# Patient Record
Sex: Female | Born: 1985 | Hispanic: No | Smoking: Never smoker
Health system: Southern US, Community
[De-identification: ages and names within clinical notes are randomized; demographics above are authoritative.]

## PROBLEM LIST (undated history)

## (undated) ENCOUNTER — Emergency Department (HOSPITAL_COMMUNITY): Discharge status: Absent without leave | Payer: Self-pay

## (undated) DIAGNOSIS — E282 Polycystic ovarian syndrome: Secondary | ICD-10-CM

## (undated) DIAGNOSIS — M25472 Effusion, left ankle: Secondary | ICD-10-CM

## (undated) DIAGNOSIS — R202 Paresthesia of skin: Secondary | ICD-10-CM

## (undated) DIAGNOSIS — R200 Anesthesia of skin: Secondary | ICD-10-CM

## (undated) DIAGNOSIS — G4700 Insomnia, unspecified: Secondary | ICD-10-CM

## (undated) DIAGNOSIS — Q369 Cleft lip, unilateral: Secondary | ICD-10-CM

## (undated) DIAGNOSIS — K219 Gastro-esophageal reflux disease without esophagitis: Secondary | ICD-10-CM

## (undated) DIAGNOSIS — T1491XA Suicide attempt, initial encounter: Secondary | ICD-10-CM

## (undated) DIAGNOSIS — M25471 Effusion, right ankle: Secondary | ICD-10-CM

## (undated) DIAGNOSIS — R519 Headache, unspecified: Secondary | ICD-10-CM

## (undated) DIAGNOSIS — T402X1A Poisoning by other opioids, accidental (unintentional), initial encounter: Secondary | ICD-10-CM

## (undated) DIAGNOSIS — R51 Headache: Secondary | ICD-10-CM

## (undated) DIAGNOSIS — Z9889 Other specified postprocedural states: Secondary | ICD-10-CM

## (undated) DIAGNOSIS — R112 Nausea with vomiting, unspecified: Secondary | ICD-10-CM

## (undated) DIAGNOSIS — J40 Bronchitis, not specified as acute or chronic: Secondary | ICD-10-CM

## (undated) HISTORY — PX: CLEFT LIP REPAIR: SUR1164

## (undated) HISTORY — PX: COLPOSCOPY: SHX161

## (undated) HISTORY — PX: ABDOMINAL HYSTERECTOMY: SHX81

## (undated) HISTORY — DX: Cleft lip, unilateral: Q36.9

## (undated) HISTORY — PX: PILONIDAL CYST EXCISION: SHX744

---
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
## 2008-06-25 ENCOUNTER — Emergency Department (HOSPITAL_COMMUNITY): Admission: EM | Admit: 2008-06-25 | Discharge: 2008-06-25 | Payer: Self-pay | Admitting: Emergency Medicine

## 2014-12-27 ENCOUNTER — Encounter (HOSPITAL_COMMUNITY): Payer: Self-pay

## 2014-12-27 ENCOUNTER — Encounter (HOSPITAL_COMMUNITY)
Admission: RE | Admit: 2014-12-27 | Discharge: 2014-12-27 | Disposition: A | Discharge status: Home / Self Care | Source: Ambulatory Visit | Attending: Obstetrics and Gynecology | Admitting: Obstetrics and Gynecology

## 2014-12-27 DIAGNOSIS — Z01812 Encounter for preprocedural laboratory examination: Secondary | ICD-10-CM | POA: Insufficient documentation

## 2014-12-27 HISTORY — DX: Bronchitis, not specified as acute or chronic: J40

## 2014-12-27 HISTORY — DX: Effusion, left ankle: M25.472

## 2014-12-27 HISTORY — DX: Headache, unspecified: R51.9

## 2014-12-27 HISTORY — DX: Polycystic ovarian syndrome: E28.2

## 2014-12-27 HISTORY — DX: Insomnia, unspecified: G47.00

## 2014-12-27 HISTORY — DX: Headache: R51

## 2014-12-27 HISTORY — DX: Paresthesia of skin: R20.2

## 2014-12-27 HISTORY — DX: Effusion, right ankle: M25.471

## 2014-12-27 HISTORY — DX: Anesthesia of skin: R20.0

## 2014-12-27 HISTORY — DX: Nausea with vomiting, unspecified: R11.2

## 2014-12-27 HISTORY — DX: Gastro-esophageal reflux disease without esophagitis: K21.9

## 2014-12-27 HISTORY — DX: Other specified postprocedural states: Z98.890

## 2014-12-27 LAB — CBC
HEMATOCRIT: 39.5 % (ref 36.0–46.0)
HEMOGLOBIN: 12.9 g/dL (ref 12.0–15.0)
MCH: 25.6 pg — ABNORMAL LOW (ref 26.0–34.0)
MCHC: 32.7 g/dL (ref 30.0–36.0)
MCV: 78.4 fL (ref 78.0–100.0)
Platelets: 286 10*3/uL (ref 150–400)
RBC: 5.04 MIL/uL (ref 3.87–5.11)
RDW: 14.5 % (ref 11.5–15.5)
WBC: 6 10*3/uL (ref 4.0–10.5)

## 2014-12-27 NOTE — Patient Instructions (Addendum)
Your procedure is scheduled on:  Friday, January 05, 2015  Enter through the Main Entrance of [facility] at: 1:00 p.m.  Pick up the phone at the desk and dial [phone].  Call this number if you have problems the morning of surgery: [phone].  Remember: Do NOT eat food: AFTER MIDNIGHT THURSDAY  Do NOT drink clear liquids after: AFTER 10:30 A.M. Friday MORNING Take these medicines the morning of surgery with a SIP OF WATER: NONE *STOP ALL VITAMINS/HERBAL SUPPLEMENTS, IBUPROFEN AND KETOROLAC  Do NOT wear jewelry (body piercing), metal hair clips/bobby pins, make-up, or nail polish. Do NOT wear lotions, powders, or perfumes.  You may wear deoderant. Do NOT shave for 48 hours prior to surgery. Do NOT bring valuables to the hospital. Contacts, dentures, or bridgework may not be worn into surgery.  Have a responsible adult drive you home and stay with you for 24 hours after your procedure

## 2015-01-05 ENCOUNTER — Encounter (HOSPITAL_COMMUNITY): Admission: RE | Payer: Self-pay | Source: Ambulatory Visit

## 2015-01-05 ENCOUNTER — Ambulatory Visit (HOSPITAL_COMMUNITY): Admission: RE | Admit: 2015-01-05 | Source: Ambulatory Visit | Admitting: Obstetrics and Gynecology

## 2015-01-05 SURGERY — LAPAROSCOPY OPERATIVE
Anesthesia: General

## 2015-01-25 ENCOUNTER — Other Ambulatory Visit: Payer: Self-pay | Admitting: Obstetrics and Gynecology

## 2015-01-29 ENCOUNTER — Encounter (HOSPITAL_COMMUNITY): Payer: Self-pay

## 2015-01-29 ENCOUNTER — Encounter (INDEPENDENT_AMBULATORY_CARE_PROVIDER_SITE_OTHER): Payer: Self-pay

## 2015-01-29 ENCOUNTER — Encounter (HOSPITAL_COMMUNITY)
Admission: RE | Admit: 2015-01-29 | Discharge: 2015-01-29 | Disposition: A | Discharge status: Home / Self Care | Source: Ambulatory Visit | Attending: Obstetrics and Gynecology | Admitting: Obstetrics and Gynecology

## 2015-01-29 DIAGNOSIS — R102 Pelvic and perineal pain: Secondary | ICD-10-CM | POA: Insufficient documentation

## 2015-01-29 DIAGNOSIS — Z01818 Encounter for other preprocedural examination: Secondary | ICD-10-CM | POA: Diagnosis not present

## 2015-01-29 DIAGNOSIS — N946 Dysmenorrhea, unspecified: Secondary | ICD-10-CM | POA: Insufficient documentation

## 2015-01-29 LAB — CBC
HCT: 39 % (ref 36.0–46.0)
Hemoglobin: 12.5 g/dL (ref 12.0–15.0)
MCH: 25.1 pg — ABNORMAL LOW (ref 26.0–34.0)
MCHC: 32.1 g/dL (ref 30.0–36.0)
MCV: 78.3 fL (ref 78.0–100.0)
Platelets: 358 10*3/uL (ref 150–400)
RBC: 4.98 MIL/uL (ref 3.87–5.11)
RDW: 15.1 % (ref 11.5–15.5)
WBC: 6.5 10*3/uL (ref 4.0–10.5)

## 2015-01-29 NOTE — Patient Instructions (Signed)
   Your procedure is scheduled on: FEB 16 AT 830AM  Enter through the Main Entrance of [facility] at 7am Pick up the phone at the desk and dial [phone] and inform us of your arrival.  Please call this number if you have any problems the morning of surgery: [phone]  Remember: Do not eat food after midnight:feb 15 Do not drink clear liquids after:feb 15 Take these medicines the morning of surgery with a SIP OF WATER: bring inhaler day of surgery   Do not wear jewelry, make-up, or FINGER nail polish No metal in your hair or on your body. Do not wear lotions, powders, perfumes.  You may wear deodorant.  Do not bring valuables to the hospital. Contacts, dentures or bridgework may not be worn into surgery.  Leave suitcase in the car. After Surgery it may be brought to your room. For patients being admitted to the hospital, checkout time is 11:00am the day of discharge.    Patients discharged on the day of surgery will not be allowed to drive home.

## 2015-01-31 ENCOUNTER — Other Ambulatory Visit (HOSPITAL_COMMUNITY): Payer: Self-pay | Admitting: Obstetrics and Gynecology

## 2015-01-31 NOTE — H&P (Signed)
Mindy Kelley is a 29 y.o.  P 0-0-9-0, who  presents for hysterectomy because of chronic pelvic pain, dysmenorrhea and menorrhagia.  Since menarche the  patient has had severe menstrual cycles characterized by severe cramping, that now is only made better with narcotic analgesia,  and a menstrual flow that lasts from 5-21 days.  She has to change her pad every 30 minutes to 3 hours  and toward the end of her bleeding she is able to manage her accompanying pain with Toradol minus the narcotics.  Aside from menstrual discomfort she experiences pelvic pain, most days of the week without initiating or alleviating factors, except  though intensity is decreased  with heating pads and ice packs.  She admits to positional dyspareunia, non-radiating lower back pain and episodes of  alternating constipation and diarrhea.  She denies any urinary tract or vaginitis symptoms.  Over the years she  has tried oral contraceptives,  the hormonal patch and ring with all providing only transient management of her symptoms.  She is currently taking oral contraception with Aygestin but only negligible change in any of her symptoms.  A pelvic ultrasound in July 2015  revealed a uterus: 5.05 x 4.22 x 3.11 cm, right ovary: 3.28 x 2.42 x 1.96 cm and left ovary: 3.05 x 2.01 x 2.20 cm.  During this evaluation the patient's CBC, CMET & TSH have been normal and gonorrhea, chlamydia,  syphilis, hepatitis B & C, HIV tests have been negative.  A review of both medical and surgical management options have been given to the patient regarding her condition.  After careful thought however, she has decided that due to the debilitating and protracted nature of her symptoms and the sub-optimal response to previous therapies that she would like to proceed with definitive therapy in the form of hysterectomy and possible bilateral oophorectomy.   Past Medical History  OB History: G: 9;  P: 0-0-9-0-recurrent miscarriages  GYN History: menarche: 29 YO     LMP: 01/13/2015    Contracepton oral contraceptives (estrogen/progesterone)  The patient reports a past history of: herpes. Remote history of abnormal PAP smears that were simply repeated;   Last PAP smear: 2016-normal  Medical History: Polycystic Ovarian Syndrome, Insulin Resistance, Hidradenitis Supprativa, Eczema, Migraine, Gastroesophageal Reflux Disease, Chronic Lower Back Pain, Vitamin D Deficiency, Pilonidal Cyst, Anxiety, Cleft Lip and Recurrent Miscarriages  Surgical History: 1987 Cleft Lip Repair and 2009 Pilonidal Cyst Excision  Report awakening from anesthesia and experiencing severe post operative nausea and vomiting but denies history of blood transfusions  Family History: Heart Disease, Asthma, Irritable Bowel Syndrome, Gastroesophageal Reflux Disease, Pulmonary Embolism, Diabetes Mellitus, Renal Failure, Stroke, Breast Cancer, Migraine and Multiple Sclerosis  Social History: Separated and currently a Consulting civil engineer in Social Work/Psychology;  Denies tobacco or alcohol use   Medications:  Dilaudid 2 mg every 4-6 hours as needed for pain FeSO4  325 mg daily Ibuprofen 800 mg with food every 8 hours as directed Ketorolac 10 mg with food every 6 hours as directed  Methocarbamol 500 mg  #2 four times a day as needed Norethindrone 5 mg daily Ortho Novum 1/35 daily Valacyclorvir 500 mg as directed HCTZ daily as directed prn  Allergies  Allergen Reactions  . Latex Hives  . Mango Flavor Hives, Itching and Swelling    Swelling is of the lips and tongue.  . Metformin And Related Nausea And Vomiting  Latex-hives  Denies sensitivity to peanuts, shellfish, soy or adhesives.   Physical Exam  Bp: 122/70  P: 82  R: 16  Temperature: 98.4 degrees F orally  Weight: 208 lbs.  Height: 5'5"  BMI: 34.6  Neck: supple without masses or thyromegaly Lungs: clear to auscultation Heart: regular rate and rhythm Abdomen: soft, tender with voluntary guarding and no organomegaly Pelvic:EGBUS-  wnl; vagina-normal rugae with brown discharge; uterus-normal size, very tender, cervix without lesions or motion tenderness; adnexae-bilateral tenderness but no  masses Extremities:  no clubbing, cyanosis or edema   Assesment: Chronic Pelvic Pain           Severe Dysmenorrhea           Menorrhagia   Disposition: Reviewed the risks of surgery to include, but not limited to: reaction to anesthesia, damage to adjacent organs, infection, excessive bleeding, possible open abdominal incision and menopausal risks if ovaries are removed (symptomatic & or cardiolvascular). The patient verbalized understanding of these risks and has consented to proceed with Laparoscopically Assisted Vaginal Hysterectomy with Bilateral Salpingectomy,  Possible Total Abdominal [facility]ste[facility] on February 06, 2015 at 7:30 a.m.  CSN# [id number]   Chesni Vos J. Lowell GuitarPowell, PA-C  for Dr. Maris BergerVanessa P. Haygood

## 2015-02-06 ENCOUNTER — Encounter (HOSPITAL_COMMUNITY): Payer: Self-pay | Admitting: Certified Registered Nurse Anesthetist

## 2015-02-06 ENCOUNTER — Ambulatory Visit (HOSPITAL_COMMUNITY): Admitting: Certified Registered Nurse Anesthetist

## 2015-02-06 ENCOUNTER — Encounter (HOSPITAL_COMMUNITY)
Admission: RE | Disposition: A | Discharge status: Home / Self Care | Payer: Self-pay | Source: Ambulatory Visit | Attending: Obstetrics and Gynecology

## 2015-02-06 ENCOUNTER — Observation Stay (HOSPITAL_COMMUNITY)
Admission: RE | Admit: 2015-02-06 | Discharge: 2015-02-07 | Disposition: A | Discharge status: Home / Self Care | Source: Ambulatory Visit | Attending: Obstetrics and Gynecology | Admitting: Obstetrics and Gynecology

## 2015-02-06 DIAGNOSIS — N946 Dysmenorrhea, unspecified: Secondary | ICD-10-CM | POA: Diagnosis present

## 2015-02-06 DIAGNOSIS — N920 Excessive and frequent menstruation with regular cycle: Secondary | ICD-10-CM | POA: Diagnosis present

## 2015-02-06 DIAGNOSIS — M25559 Pain in unspecified hip: Secondary | ICD-10-CM | POA: Diagnosis present

## 2015-02-06 DIAGNOSIS — E282 Polycystic ovarian syndrome: Secondary | ICD-10-CM | POA: Diagnosis not present

## 2015-02-06 DIAGNOSIS — N939 Abnormal uterine and vaginal bleeding, unspecified: Secondary | ICD-10-CM | POA: Diagnosis present

## 2015-02-06 DIAGNOSIS — Z8742 Personal history of other diseases of the female genital tract: Secondary | ICD-10-CM

## 2015-02-06 DIAGNOSIS — R102 Pelvic and perineal pain: Secondary | ICD-10-CM | POA: Diagnosis present

## 2015-02-06 HISTORY — PX: LAPAROSCOPIC ASSISTED VAGINAL HYSTERECTOMY: SHX5398

## 2015-02-06 LAB — PREGNANCY, URINE: PREG TEST UR: NEGATIVE

## 2015-02-06 SURGERY — HYSTERECTOMY, VAGINAL, LAPAROSCOPY-ASSISTED
Anesthesia: General | Site: Abdomen | Laterality: Bilateral

## 2015-02-06 MED ORDER — NEOSTIGMINE METHYLSULFATE 10 MG/10ML IV SOLN
INTRAVENOUS | Status: AC
Start: 2015-02-06 — End: 2015-02-06
  Filled 2015-02-06: qty 1

## 2015-02-06 MED ORDER — 0.9 % SODIUM CHLORIDE (POUR BTL) OPTIME
TOPICAL | Status: DC | PRN
  Administered 2015-02-06: 1000 mL

## 2015-02-06 MED ORDER — ONDANSETRON HCL 4 MG/2ML IJ SOLN
INTRAMUSCULAR | Status: DC | PRN
  Administered 2015-02-06: 4 mg via INTRAVENOUS

## 2015-02-06 MED ORDER — VASOPRESSIN 20 UNIT/ML IV SOLN
INTRAVENOUS | Status: DC | PRN
  Administered 2015-02-06: 20 mL via INTRAMUSCULAR

## 2015-02-06 MED ORDER — DIPHENHYDRAMINE HCL 12.5 MG/5ML PO ELIX: 12.5000 mg | ORAL_SOLUTION | Freq: Four times a day (QID) | ORAL | Status: DC | PRN

## 2015-02-06 MED ORDER — PANTOPRAZOLE SODIUM 40 MG PO TBEC
40.0000 mg | DELAYED_RELEASE_TABLET | Freq: Every day | ORAL | Status: AC
  Administered 2015-02-06: 40 mg via ORAL

## 2015-02-06 MED ORDER — LACTATED RINGERS IV SOLN
INTRAVENOUS | Status: DC
Start: 2015-02-06 — End: 2015-02-06
  Administered 2015-02-06 (×3): via INTRAVENOUS

## 2015-02-06 MED ORDER — MIDAZOLAM HCL 2 MG/2ML IJ SOLN
INTRAMUSCULAR | Status: AC
  Filled 2015-02-06: qty 2

## 2015-02-06 MED ORDER — LACTATED RINGERS IV SOLN
INTRAVENOUS | Status: DC
  Administered 2015-02-06 – 2015-02-07 (×2): via INTRAVENOUS

## 2015-02-06 MED ORDER — SCOPOLAMINE 1 MG/3DAYS TD PT72
1.0000 | MEDICATED_PATCH | Freq: Once | TRANSDERMAL | Status: DC
  Administered 2015-02-06: 1.5 mg via TRANSDERMAL

## 2015-02-06 MED ORDER — GLYCOPYRROLATE 0.2 MG/ML IJ SOLN
INTRAMUSCULAR | Status: AC
  Filled 2015-02-06: qty 3

## 2015-02-06 MED ORDER — SODIUM CHLORIDE 0.9 % IJ SOLN
INTRAMUSCULAR | Status: AC
  Filled 2015-02-06: qty 50

## 2015-02-06 MED ORDER — ONDANSETRON HCL 4 MG/2ML IJ SOLN
4.0000 mg | Freq: Four times a day (QID) | INTRAMUSCULAR | Status: DC | PRN
  Administered 2015-02-06: 4 mg via INTRAVENOUS
  Filled 2015-02-06: qty 2

## 2015-02-06 MED ORDER — DIPHENHYDRAMINE HCL 50 MG/ML IJ SOLN
INTRAMUSCULAR | Status: DC | PRN
  Administered 2015-02-06: 25 mg via INTRAVENOUS

## 2015-02-06 MED ORDER — DIPHENHYDRAMINE HCL 50 MG/ML IJ SOLN
INTRAMUSCULAR | Status: AC
  Filled 2015-02-06: qty 1

## 2015-02-06 MED ORDER — BUPIVACAINE HCL (PF) 0.25 % IJ SOLN
INTRAMUSCULAR | Status: DC | PRN
  Administered 2015-02-06: 10 mL

## 2015-02-06 MED ORDER — GLYCOPYRROLATE 0.2 MG/ML IJ SOLN
INTRAMUSCULAR | Status: DC | PRN
  Administered 2015-02-06: 0.3 mg via INTRAVENOUS

## 2015-02-06 MED ORDER — MEPERIDINE HCL 25 MG/ML IJ SOLN: 6.2500 mg | INTRAMUSCULAR | Status: DC | PRN

## 2015-02-06 MED ORDER — DEXTROSE 5 % IV SOLN
2.0000 g | Freq: Two times a day (BID) | INTRAVENOUS | Status: DC
  Administered 2015-02-06: 2 g via INTRAVENOUS
  Filled 2015-02-06 (×4): qty 2

## 2015-02-06 MED ORDER — LIDOCAINE HCL (CARDIAC) 20 MG/ML IV SOLN
INTRAVENOUS | Status: DC | PRN
  Administered 2015-02-06: 80 mg via INTRAVENOUS

## 2015-02-06 MED ORDER — MENTHOL 3 MG MT LOZG: 1.0000 | LOZENGE | OROMUCOSAL | Status: DC | PRN

## 2015-02-06 MED ORDER — KETOROLAC TROMETHAMINE 30 MG/ML IJ SOLN
30.0000 mg | Freq: Four times a day (QID) | INTRAMUSCULAR | Status: AC
Start: 2015-02-06 — End: 2015-02-07
  Administered 2015-02-06 – 2015-02-07 (×3): 30 mg via INTRAVENOUS
  Filled 2015-02-06 (×3): qty 1

## 2015-02-06 MED ORDER — ONDANSETRON HCL 4 MG PO TABS: 4.0000 mg | ORAL_TABLET | Freq: Three times a day (TID) | ORAL | Status: DC | PRN

## 2015-02-06 MED ORDER — FENTANYL CITRATE 0.05 MG/ML IJ SOLN
INTRAMUSCULAR | Status: AC
  Filled 2015-02-06: qty 5

## 2015-02-06 MED ORDER — ESTRADIOL 0.1 MG/GM VA CREA
TOPICAL_CREAM | VAGINAL | Status: AC
  Filled 2015-02-06: qty 42.5

## 2015-02-06 MED ORDER — MIDAZOLAM HCL 2 MG/2ML IJ SOLN
INTRAMUSCULAR | Status: DC | PRN
  Administered 2015-02-06: 2 mg via INTRAVENOUS

## 2015-02-06 MED ORDER — LIDOCAINE HCL (CARDIAC) 20 MG/ML IV SOLN
INTRAVENOUS | Status: AC
  Filled 2015-02-06: qty 5

## 2015-02-06 MED ORDER — SCOPOLAMINE 1 MG/3DAYS TD PT72
MEDICATED_PATCH | TRANSDERMAL | Status: AC
  Filled 2015-02-06: qty 1

## 2015-02-06 MED ORDER — FENTANYL CITRATE 0.05 MG/ML IJ SOLN
INTRAMUSCULAR | Status: DC | PRN
  Administered 2015-02-06: 25 ug via INTRAVENOUS
  Administered 2015-02-06 (×2): 100 ug via INTRAVENOUS
  Administered 2015-02-06: 50 ug via INTRAVENOUS
  Administered 2015-02-06 (×2): 100 ug via INTRAVENOUS
  Administered 2015-02-06: 50 ug via INTRAVENOUS
  Administered 2015-02-06: 25 ug via INTRAVENOUS
  Administered 2015-02-06: 50 ug via INTRAVENOUS

## 2015-02-06 MED ORDER — ESTRADIOL 0.1 MG/GM VA CREA
TOPICAL_CREAM | VAGINAL | Status: DC | PRN
  Administered 2015-02-06: 1 via VAGINAL

## 2015-02-06 MED ORDER — KETOROLAC TROMETHAMINE 30 MG/ML IJ SOLN
INTRAMUSCULAR | Status: AC
  Filled 2015-02-06: qty 1

## 2015-02-06 MED ORDER — HYDROMORPHONE HCL 1 MG/ML IJ SOLN
INTRAMUSCULAR | Status: AC
  Filled 2015-02-06: qty 1

## 2015-02-06 MED ORDER — ROCURONIUM BROMIDE 100 MG/10ML IV SOLN
INTRAVENOUS | Status: DC | PRN
  Administered 2015-02-06 (×4): 10 mg via INTRAVENOUS
  Administered 2015-02-06: 20 mg via INTRAVENOUS
  Administered 2015-02-06: 10 mg via INTRAVENOUS
  Administered 2015-02-06: 50 mg via INTRAVENOUS

## 2015-02-06 MED ORDER — PANTOPRAZOLE SODIUM 40 MG PO TBEC
DELAYED_RELEASE_TABLET | ORAL | Status: AC
  Administered 2015-02-06: 40 mg via ORAL
  Filled 2015-02-06: qty 1

## 2015-02-06 MED ORDER — HYDROMORPHONE HCL 1 MG/ML IJ SOLN
INTRAMUSCULAR | Status: AC
  Administered 2015-02-06: 0.5 mg via INTRAVENOUS
  Filled 2015-02-06: qty 1

## 2015-02-06 MED ORDER — METOCLOPRAMIDE HCL 5 MG/ML IJ SOLN: 10.0000 mg | Freq: Once | INTRAMUSCULAR | Status: DC | PRN

## 2015-02-06 MED ORDER — ACETAMINOPHEN 10 MG/ML IV SOLN
1000.0000 mg | Freq: Once | INTRAVENOUS | Status: AC
  Administered 2015-02-06: 1000 mg via INTRAVENOUS
  Filled 2015-02-06: qty 100

## 2015-02-06 MED ORDER — ROCURONIUM BROMIDE 100 MG/10ML IV SOLN
INTRAVENOUS | Status: AC
  Filled 2015-02-06: qty 1

## 2015-02-06 MED ORDER — KETOROLAC TROMETHAMINE 30 MG/ML IJ SOLN
INTRAMUSCULAR | Status: DC | PRN
Start: 2015-02-06 — End: 2015-02-06
  Administered 2015-02-06: 30 mg via INTRAVENOUS

## 2015-02-06 MED ORDER — HYDROMORPHONE HCL 1 MG/ML IJ SOLN
0.2500 mg | INTRAMUSCULAR | Status: DC | PRN
  Administered 2015-02-06 (×3): 0.5 mg via INTRAVENOUS

## 2015-02-06 MED ORDER — NALOXONE HCL 0.4 MG/ML IJ SOLN: 0.4000 mg | INTRAMUSCULAR | Status: DC | PRN

## 2015-02-06 MED ORDER — HYDROCODONE-ACETAMINOPHEN 5-325 MG PO TABS
1.0000 | ORAL_TABLET | ORAL | Status: DC | PRN
  Administered 2015-02-07 (×2): 2 via ORAL
  Filled 2015-02-06 (×2): qty 2

## 2015-02-06 MED ORDER — DEXAMETHASONE SODIUM PHOSPHATE 10 MG/ML IJ SOLN
INTRAMUSCULAR | Status: DC | PRN
  Administered 2015-02-06: 4 mg via INTRAVENOUS

## 2015-02-06 MED ORDER — IBUPROFEN 600 MG PO TABS: 600.0000 mg | ORAL_TABLET | Freq: Four times a day (QID) | ORAL | Status: DC | PRN

## 2015-02-06 MED ORDER — VASOPRESSIN 20 UNIT/ML IV SOLN
INTRAVENOUS | Status: AC
  Filled 2015-02-06: qty 1

## 2015-02-06 MED ORDER — HYDROMORPHONE HCL 1 MG/ML IJ SOLN
INTRAMUSCULAR | Status: DC | PRN
  Administered 2015-02-06 (×2): 1 mg via INTRAVENOUS

## 2015-02-06 MED ORDER — DIPHENHYDRAMINE HCL 50 MG/ML IJ SOLN: 12.5000 mg | Freq: Four times a day (QID) | INTRAMUSCULAR | Status: DC | PRN

## 2015-02-06 MED ORDER — PROPOFOL 10 MG/ML IV BOLUS
INTRAVENOUS | Status: AC
  Filled 2015-02-06: qty 20

## 2015-02-06 MED ORDER — BUPIVACAINE HCL (PF) 0.25 % IJ SOLN
INTRAMUSCULAR | Status: AC
  Filled 2015-02-06: qty 30

## 2015-02-06 MED ORDER — DEXAMETHASONE SODIUM PHOSPHATE 4 MG/ML IJ SOLN
INTRAMUSCULAR | Status: AC
  Filled 2015-02-06: qty 1

## 2015-02-06 MED ORDER — NEOSTIGMINE METHYLSULFATE 10 MG/10ML IV SOLN
INTRAVENOUS | Status: DC | PRN
  Administered 2015-02-06: 2 mg via INTRAVENOUS

## 2015-02-06 MED ORDER — HYDROMORPHONE 0.3 MG/ML IV SOLN
INTRAVENOUS | Status: DC
  Administered 2015-02-06: 5 mL via INTRAVENOUS
  Administered 2015-02-06: 2.1 mg via INTRAVENOUS
  Administered 2015-02-06: 16:00:00 via INTRAVENOUS
  Administered 2015-02-07: 2.4 mg via INTRAVENOUS
  Filled 2015-02-06: qty 25

## 2015-02-06 MED ORDER — PROPOFOL 10 MG/ML IV BOLUS
INTRAVENOUS | Status: DC | PRN
  Administered 2015-02-06: 180 mg via INTRAVENOUS
  Administered 2015-02-06: 20 mg via INTRAVENOUS

## 2015-02-06 MED ORDER — SODIUM CHLORIDE 0.9 % IJ SOLN: 9.0000 mL | INTRAMUSCULAR | Status: DC | PRN

## 2015-02-06 MED ORDER — FENTANYL CITRATE 0.05 MG/ML IJ SOLN
INTRAMUSCULAR | Status: AC
  Filled 2015-02-06: qty 2

## 2015-02-06 MED ORDER — ONDANSETRON HCL 4 MG/2ML IJ SOLN
INTRAMUSCULAR | Status: AC
  Filled 2015-02-06: qty 2

## 2015-02-06 MED ORDER — ALBUTEROL SULFATE (2.5 MG/3ML) 0.083% IN NEBU: 3.0000 mL | INHALATION_SOLUTION | Freq: Four times a day (QID) | RESPIRATORY_TRACT | Status: DC | PRN

## 2015-02-06 SURGICAL SUPPLY — 62 items
CABLE HIGH FREQUENCY MONO STRZ (ELECTRODE) IMPLANT
CLOTH BEACON ORANGE TIMEOUT ST (SAFETY) ×2 IMPLANT
CONT PATH 16OZ SNAP LID 3702 (MISCELLANEOUS) ×2 IMPLANT
COVER BACK TABLE 60X90IN (DRAPES) ×2 IMPLANT
COVER LIGHT HANDLE  1/PK (MISCELLANEOUS) ×2
COVER LIGHT HANDLE 1/PK (MISCELLANEOUS) ×2 IMPLANT
DECANTER SPIKE VIAL GLASS SM (MISCELLANEOUS) IMPLANT
DRAPE SHEET LG 3/4 BI-LAMINATE (DRAPES) ×4 IMPLANT
DRESSING OPSITE X SMALL 2X3 (GAUZE/BANDAGES/DRESSINGS) ×2 IMPLANT
DRSG COVADERM PLUS 2X2 (GAUZE/BANDAGES/DRESSINGS) ×4 IMPLANT
DRSG OPSITE POSTOP 3X4 (GAUZE/BANDAGES/DRESSINGS) IMPLANT
DRSG TELFA 3X8 NADH (GAUZE/BANDAGES/DRESSINGS) ×2 IMPLANT
DURAPREP 26ML APPLICATOR (WOUND CARE) ×2 IMPLANT
ELECT REM PT RETURN 9FT ADLT (ELECTROSURGICAL) ×2
ELECTRODE REM PT RTRN 9FT ADLT (ELECTROSURGICAL) ×1 IMPLANT
EVACUATOR SMOKE 8.L (FILTER) ×2 IMPLANT
FORCEPS CUTTING 33CM 5MM (CUTTING FORCEPS) IMPLANT
FORCEPS CUTTING 45CM 5MM (CUTTING FORCEPS) IMPLANT
GAUZE PACKING 2X5 YD STRL (GAUZE/BANDAGES/DRESSINGS) ×2 IMPLANT
GLOVE BIOGEL PI IND STRL 6.5 (GLOVE) ×1 IMPLANT
GLOVE BIOGEL PI IND STRL 7.0 (GLOVE) ×3 IMPLANT
GLOVE BIOGEL PI IND STRL 8 (GLOVE) ×2 IMPLANT
GLOVE BIOGEL PI IND STRL 8.5 (GLOVE) ×2 IMPLANT
GLOVE BIOGEL PI INDICATOR 6.5 (GLOVE) ×1
GLOVE BIOGEL PI INDICATOR 7.0 (GLOVE) ×3
GLOVE BIOGEL PI INDICATOR 8 (GLOVE) ×2
GLOVE BIOGEL PI INDICATOR 8.5 (GLOVE) ×2
GLOVE SURG SS PI 6.5 STRL IVOR (GLOVE) ×6 IMPLANT
GLOVE SURG SS PI 7.0 STRL IVOR (GLOVE) ×8 IMPLANT
LIQUID BAND (GAUZE/BANDAGES/DRESSINGS) ×2 IMPLANT
NEEDLE MAYO .5 CIRCLE (NEEDLE) ×2 IMPLANT
NS IRRIG 1000ML POUR BTL (IV SOLUTION) ×4 IMPLANT
PACK LAVH (CUSTOM PROCEDURE TRAY) ×2 IMPLANT
PACK ROBOTIC GOWN (GOWN DISPOSABLE) ×2 IMPLANT
PAD MAGNETIC INST (MISCELLANEOUS) ×2 IMPLANT
PAD POSITIONER PINK NONSTERILE (MISCELLANEOUS) ×2 IMPLANT
PROTECTOR NERVE ULNAR (MISCELLANEOUS) ×2 IMPLANT
SET IRRIG TUBING LAPAROSCOPIC (IRRIGATION / IRRIGATOR) IMPLANT
SHEARS HARMONIC ACE PLUS 36CM (ENDOMECHANICALS) IMPLANT
SOLUTION ELECTROLUBE (MISCELLANEOUS) IMPLANT
STRIP CLOSURE SKIN 1/4X3 (GAUZE/BANDAGES/DRESSINGS) IMPLANT
SUT CHROMIC 2 0 TIES 18 (SUTURE) IMPLANT
SUT VIC AB 0 CT1 18XCR BRD8 (SUTURE) ×3 IMPLANT
SUT VIC AB 0 CT1 27 (SUTURE)
SUT VIC AB 0 CT1 27XCR 8 STRN (SUTURE) IMPLANT
SUT VIC AB 0 CT1 8-18 (SUTURE) ×3
SUT VIC AB 2-0 SH 27 (SUTURE) ×2
SUT VIC AB 2-0 SH 27XBRD (SUTURE) ×2 IMPLANT
SUT VIC AB 3-0 PS2 18 (SUTURE) ×2
SUT VIC AB 3-0 PS2 18XBRD (SUTURE) ×2 IMPLANT
SUT VICRYL 0 ENDOLOOP (SUTURE) ×2 IMPLANT
SUT VICRYL 0 TIES 12 18 (SUTURE) ×2 IMPLANT
SUT VICRYL 0 UR6 27IN ABS (SUTURE) ×4 IMPLANT
SYR 20CC LL (SYRINGE) ×2 IMPLANT
SYR 30ML LL (SYRINGE) ×2 IMPLANT
SYR 50ML LL SCALE MARK (SYRINGE) ×2 IMPLANT
SYR TB 1ML LUER SLIP (SYRINGE) ×2 IMPLANT
TOWEL OR 17X24 6PK STRL BLUE (TOWEL DISPOSABLE) ×4 IMPLANT
TROCAR BALL TOP DISP 5MM (ENDOMECHANICALS) ×2 IMPLANT
TROCAR XCEL DIL TIP R 11M (ENDOMECHANICALS) ×2 IMPLANT
WARMER LAPAROSCOPE (MISCELLANEOUS) ×2 IMPLANT
WATER STERILE IRR 1000ML POUR (IV SOLUTION) ×2 IMPLANT

## 2015-02-06 NOTE — Transfer of Care (Signed)
Immediate Anesthesia Transfer of Care Note  Patient: Mindy Kelley  Procedure(s) Performed: Procedure(s): LAPAROSCOPIC ASSISTED VAGINAL HYSTERECTOMY, BILATERAL SALPINGO-OOPHORECTOMY, BIOPSY PELVIC PERITOMEUM (Bilateral)  Patient Location: PACU  Anesthesia Type:General  Level of Consciousness: awake, alert , oriented and patient cooperative  Airway & Oxygen Therapy: Patient Spontanous Breathing and Patient connected to nasal cannula oxygen  Post-op Assessment: Report given to RN and Post -op Vital signs reviewed and stable  Post vital signs: Reviewed and stable  Last Vitals:  Filed Vitals:   02/06/15 0655  BP: 138/82  Pulse: 69  Temp: 36.7 C  Resp: 20    Complications: No apparent anesthesia complications

## 2015-02-06 NOTE — Anesthesia Preprocedure Evaluation (Signed)
Anesthesia Evaluation  Patient identified by MRN, date of birth, ID band Patient awake    Reviewed: Allergy & Precautions, NPO status , Patient's Chart, lab work & pertinent test results, reviewed documented beta blocker date and time , Unable to perform ROS - Chart review only  History of Anesthesia Complications (+) PONV and history of anesthetic complications  Airway Mallampati: III  TM Distance: >3 FB Neck ROM: Full    Dental no notable dental hx. (+) Teeth Intact   Pulmonary neg pulmonary ROS,  breath sounds clear to auscultation  Pulmonary exam normal       Cardiovascular negative cardio ROS  Rhythm:Regular Rate:Normal     Neuro/Psych  Headaches, negative psych ROS   GI/Hepatic Neg liver ROS, GERD-  Medicated and Controlled,  Endo/Other  Obesity  Renal/GU negative Renal ROS  negative genitourinary   Musculoskeletal negative musculoskeletal ROS (+)   Abdominal (+) + obese,   Peds  Hematology negative hematology ROS (+)   Anesthesia Other Findings S/P Cleft lip repair  Reproductive/Obstetrics Pelvic pain Dysmenorrhea Irregular periods                             Anesthesia Physical Anesthesia Plan  ASA: II  Anesthesia Plan: General   Post-op Pain Management:    Induction: Intravenous  Airway Management Planned: Oral ETT  Additional Equipment:   Intra-op Plan:   Post-operative Plan: Extubation in OR  Informed Consent: I have reviewed the patients History and Physical, chart, labs and discussed the procedure including the risks, benefits and alternatives for the proposed anesthesia with the patient or authorized representative who has indicated his/her understanding and acceptance.   Dental advisory given  Plan Discussed with: CRNA, Anesthesiologist and Surgeon  Anesthesia Plan Comments:         Anesthesia Quick Evaluation

## 2015-02-06 NOTE — H&P (Signed)
History and Physical Interval Note:   02/06/2015   8:22 AM   Mindy MunsonShameka Dillard  has presented today for surgery, with the diagnosis of Pelvic Pain; Irregular Periods, Dysmenorrhea  The various methods of treatment have been discussed with the patient and family. After consideration of risks, benefits and other options for treatment, the patient has consented to  Procedure(s): LAPAROSCOPIC ASSISTED VAGINAL HYSTERECTOMY WITH BILATERAL SALPINGO-OOPHORECTOMY as a surgical intervention .  I have examined the patient, reviewed the patients' chart and labs, and had an additional discussion with her surrounding her desire for removal of both tubes and ovaries.  She assures me she has given long consideration to this decision, as well as discussed it with family members who have suffered with similar symptoms at a young age.  She is confident in her decision as she has stated on each of her office visits previously..  Questions were answered to the patient's satisfaction. She is ready to proceed.    Hal MoralesHAYGOOD,Amyla Heffner P  MD

## 2015-02-06 NOTE — Progress Notes (Signed)
Ms. Mindy EllisShameka Kelley is a 29 y.o. year old female.  Day 0 LAVH and BSO  Subjective:  C/O pain in her LLQ. Tolerating liquids.  Objective:  BP 139/81 mmHg  Pulse 71  Temp(Src) 98.7 F (37.1 C) (Oral)  Resp 11  Ht 5\' 5"  (1.651 m)  Wt 202 lb (91.627 kg)  BMI 33.61 kg/m2  SpO2 98%   CBC    Component Value Date/Time   WBC 6.5 01/29/2015 0849   RBC 4.98 01/29/2015 0849   HGB 12.5 01/29/2015 0849   HCT 39.0 01/29/2015 0849   PLT 358 01/29/2015 0849   MCV 78.3 01/29/2015 0849   MCH 25.1* 01/29/2015 0849   MCHC 32.1 01/29/2015 0849   RDW 15.1 01/29/2015 0849    Chest: Clear Heart: RRR Abd: soft, BS faint, appropriately tender Ext: WNL Pad: No bleeding  U.O.= 300 cc's since1:00 p.m.  Assessment:  Stable post op LAVH and BSO  Plan:  Remove vaginal pack. Routine post-op care. Advance diet. Ambulate.  Leonard SchwartzArthur Vernon Georgann Bramble M.D.  02/06/15  7:05 PM

## 2015-02-06 NOTE — Op Note (Signed)
Procedure(s): LAPAROSCOPIC ASSISTED VAGINAL HYSTERECTOMY, BILATERAL SALPINGO-OOPHORECTOMY, BIOPSY PELVIC PERITOMEUM Procedure Note  Oretha EllisShameka Dillard female 29 y.o. 02/06/2015  Procedure(s) and Anesthesia Type:    * LAPAROSCOPIC ASSISTED VAGINAL HYSTERECTOMY, BILATERAL SALPINGO-OOPHORECTOMY, BIOPSY PELVIC PERITOMEUM - General  Surgeon(s) and Role:    * Hal MoralesVanessa P Haygood, MD - Primary  Preoperative diagnoses: Pelvic pain, menorrhagia, dysmenorrhea, polycystic ovarian syndrome, rule out endometriosis       Surgeon: Hal MoralesHAYGOOD,VANESSA P   Assistants: Henreitta LeberElmira Powell, PA-C  Anesthesia: General endotracheal anesthesia  ASA Class: 2    Procedure Detail  LAPAROSCOPIC ASSISTED VAGINAL HYSTERECTOMY, BILATERAL SALPINGO-OOPHORECTOMY, BIOPSY PELVIC PERITOMEUM  Findings: The uterus, tubes and ovaries appeared normal without adhesions or stigmata of endometriosis. The anterior cul-de-sac contained a powder burn lesion in several clear vesicular lesions all of which could be consistent with endometriosis.  Estimated Blood Loss:  200 mL         Drains: Foley catheter  Blood Given: none          Specimens: Uterus, bilateral ovaries and tubes, biopsy of anterior pelvic peritoneum              Complications: None         Disposition: PACU - hemodynamically stable.         Condition: stable  Procedure: The patient was taken to the operating room after appropriate identification placed on the operating table. After the attainment of adequate general anesthesia she was placed in the modified lithotomy position. The abdomen was prepped with ChloraPrep. The perineum and vagina were prepped with multiple layers of Betadine. A Foley catheter was placed into the bladder under sterile conditions and connected to straight drainage. The abdomen and perineum was draped as a sterile field. Subumbilical incision. Suprapubic injections of quarter percent Marcaine was undertaken for a total of 10 cc. A  subumbilical incision was made and taken down to the level of the fascia. The fascia was incised and marked with sutures of 0 Vicryl. The peritoneum was entered bluntly and a Hassan cannula placed into the peritoneal cavity under direct visualization. The sutures which had marked fascia were used to secure the [facility]ula.  A pneumoperitoneum was created with 3 L of CO2. The laparoscope was placed through the [facility] cannula sleeves. Suprapubic incisions to the right and left of midline were made under direct visualization, and laparoscopic probe trochars placed through those incisions into the peritoneal cavity under direct visualization. The above-noted findings were made and documented. The left round ligament was identified, then cauterized and incised with a harmonic scalpel. The Harmonic scalpel was then used to divide the tubo-ovarian junction and the utero-ovarian ligament.  The ureter was identified and skeletonization of the infundibulopelvic ligament undertaken. Two Endoloops were then placed around the infundibulopelvic ligament and tied down.  The infundibulopelvic ligament was incised and the left tube and ovary placed in the cul-de-sac for subsequent retrieval. A similar procedure was carried out on the right side with the round ligament, the utero-ovarian ligament, and the uterotubal junction as well as excision of the right tube and ovary. An area in the anterior cul-de-sac, which comprised a light powder burn lesion and vesicular lesion. Both which could be consistent with endometriosis was sharply excised using scissors and removed from the operative field through the left suprapubic trocar sleeve. Hemostasis was noted to be adequate and the surgeons moved to the vaginal area for completion of the hysterectomy. A weighted speculum was placed in the posterior vagina and Lahey tenaculum placed on  the anterior and posterior surfaces of the cervix. The cervicovaginal mucosa was injected with a  dilute solution of Pitressin. Then incised. The anterior vaginal mucosa was bluntly dissected off the anterior cervix. The posterior vaginal mucosa was sharply incised to enter the posterior cul-de-sac which was tagged. Uterosacral ligaments on the right and left side were then clamped cut and suture-ligated and those sutures held. The paracervical tissues, uterine arteries, and parametrial tissues were successively clamped, cut and suture ligated to the level of the laparoscopic dissection which allowed the uterus to be removed from the operative field. Hemostasis was noted to be adequate and the tubes and ovaries which had been previously excised were removed from the posterior cul-de-sac. And McCall culdoplasty suture was placed in the posterior cul-de-sac, incorporating the 2 uterosacral ligaments and the intervening posterior peritoneum. The vaginal angles were closed with the sutures which were held from the uterosacral ligaments by placing that suture anteriorly and posteriorly and tying down the vaginal angles. The remainder of the vaginal cuff was closed with figure-of-eight sutures incorporating the anterior vaginal mucosa, anterior peritoneum, posterior peritoneum and posterior vaginal mucosa in each stitch. Hemostasis was noted to be adequate and the [facility] culdoplasty suture tied down. All sutures used for the hysterectomy were 0 Vicryl. The vagina was packed with 2 inch vaginal packing which had been moistened with Estrace cream. The surgeons changed gown and glove and returned to the abdomen to inspect the peritoneal cavity. A pneumoperitoneum was re-created and the postoperative peritoneal surfaces inspected with adequate hemostasis noted. All instruments were then removed from the peritoneal cavity under direct visualization. CO2 was allowed to escape. The sutures which had held. [facility] in place were used to create figure-of-eight sutures in the fascia for fascial closure. The subumbilical  incision was closed with a subcuticular suture of 3-0 Vicryl. The suprapubic incisions were closed with Dermabond. Sterile dressings were applied and the patient was awakened from general anesthesia and taken to the recovery room in satisfactory condition having tolerated the procedure well with sponge and instrument counts correct.

## 2015-02-06 NOTE — Anesthesia Procedure Notes (Signed)
Procedure Name: Intubation Date/Time: 02/06/2015 8:37 AM Performed by: Yolonda KidaARVER, Haji Delaine L Pre-anesthesia Checklist: Patient identified, Emergency Drugs available, Suction available and Patient being monitored Patient Re-evaluated:Patient Re-evaluated prior to inductionOxygen Delivery Method: Circle system utilized Preoxygenation: Pre-oxygenation with 100% oxygen Intubation Type: IV induction Ventilation: Mask ventilation without difficulty Laryngoscope Size: Mac and 3 Grade View: Grade I Tube type: Oral Tube size: 7.0 mm Number of attempts: 1 Airway Equipment and Method: Stylet and Bite block Placement Confirmation: ETT inserted through vocal cords under direct vision,  positive ETCO2,  CO2 detector and breath sounds checked- equal and bilateral Secured at: 21 cm Tube secured with: Tape Dental Injury: Teeth and Oropharynx as per pre-operative assessment

## 2015-02-06 NOTE — OR Nursing (Signed)
Spoke with Mindy ScotlandAmanda Everett at 1130 per Dr Pennie RushingHaygood request for update family, "still working, everything going well"

## 2015-02-06 NOTE — Anesthesia Postprocedure Evaluation (Signed)
  Anesthesia Post-op Note  Patient: Mindy Kelley  Procedure(s) Performed: Procedure(s): LAPAROSCOPIC ASSISTED VAGINAL HYSTERECTOMY, BILATERAL SALPINGO-OOPHORECTOMY, BIOPSY PELVIC PERITOMEUM (Bilateral)  Patient Location: PACU  Anesthesia Type:General  Level of Consciousness: awake, alert  and oriented  Airway and Oxygen Therapy: Patient Spontanous Breathing and Patient connected to nasal cannula oxygen  Post-op Pain: mild, moderate  Post-op Assessment: Post-op Vital signs reviewed, Patient's Cardiovascular Status Stable, Respiratory Function Stable, Patent Airway, No signs of Nausea or vomiting and Pain level controlled  Post-op Vital Signs: Reviewed and stable  Last Vitals:  Filed Vitals:   02/06/15 1331  BP:   Pulse:   Temp: 37.2 C  Resp:     Complications: No apparent anesthesia complications

## 2015-02-07 ENCOUNTER — Encounter (HOSPITAL_COMMUNITY): Payer: Self-pay | Admitting: Obstetrics and Gynecology

## 2015-02-07 DIAGNOSIS — E282 Polycystic ovarian syndrome: Secondary | ICD-10-CM | POA: Diagnosis not present

## 2015-02-07 LAB — CBC
HCT: 36 % (ref 36.0–46.0)
HEMOGLOBIN: 11.6 g/dL — AB (ref 12.0–15.0)
MCH: 25.6 pg — AB (ref 26.0–34.0)
MCHC: 32.2 g/dL (ref 30.0–36.0)
MCV: 79.3 fL (ref 78.0–100.0)
Platelets: 271 10*3/uL (ref 150–400)
RBC: 4.54 MIL/uL (ref 3.87–5.11)
RDW: 14.2 % (ref 11.5–15.5)
WBC: 9.2 10*3/uL (ref 4.0–10.5)

## 2015-02-07 MED ORDER — CYCLOBENZAPRINE HCL 5 MG PO TABS: ORAL_TABLET | ORAL | Status: AC

## 2015-02-07 MED ORDER — ONDANSETRON HCL 4 MG PO TABS: 4.0000 mg | ORAL_TABLET | Freq: Three times a day (TID) | ORAL | Status: DC | PRN

## 2015-02-07 MED ORDER — IBUPROFEN 600 MG PO TABS: ORAL_TABLET | ORAL | Status: DC

## 2015-02-07 MED ORDER — HYDROCODONE-ACETAMINOPHEN 5-325 MG PO TABS: 1.0000 | ORAL_TABLET | ORAL | Status: DC | PRN

## 2015-02-07 MED ORDER — ESTRADIOL 0.0375 MG/24HR TD PTTW: 1.0000 | MEDICATED_PATCH | TRANSDERMAL | Status: DC

## 2015-02-07 MED ORDER — AMITRIPTYLINE HCL 25 MG PO TABS: 25.0000 mg | ORAL_TABLET | Freq: Every day | ORAL | Status: DC

## 2015-02-07 MED ORDER — AMITRIPTYLINE HCL 25 MG PO TABS: 150.0000 mg | ORAL_TABLET | Freq: Every day | ORAL | Status: DC

## 2015-02-07 NOTE — Discharge Instructions (Signed)
Call [facility] @ [phone] if:  You have a temperature greater than or equal to 100.4 degrees Farenheit orally You have pain that is not made better by the pain medication given and taken as directed You have excessive bleeding or problems urinating  Take Colace (Docusate Sodium/Stool Softener) 100 mg 2-3 times daily while taking narcotic pain medicine to avoid constipation or until bowel movements are regular. Take your Ibuprofen 600 mg with food every 6 hours for 5 days then as needed for pain Take your Amitriptyline 25 mg at bedtime each night (to manage "nerve" pain)  You may drive after 2 weeks You may walk up steps  You may shower  You may resume a regular diet  Keep incisions clean and dry Do not lift over 15 pounds for 6 weeks Avoid anything in vagina for 6 weeks (or until after your post-operative visit)

## 2015-02-07 NOTE — Anesthesia Postprocedure Evaluation (Signed)
  Anesthesia Post-op Note  Patient: Mindy Kelley  Procedure(s) Performed: Procedure(s): LAPAROSCOPIC ASSISTED VAGINAL HYSTERECTOMY, BILATERAL SALPINGO-OOPHORECTOMY, BIOPSY PELVIC PERITOMEUM (Bilateral)  Patient Location: [facility] Unit  Anesthesia Type:General  Level of Consciousness: awake, alert , oriented and patient cooperative  Airway and Oxygen Therapy: Patient Spontanous Breathing  Post-op Pain: none  Post-op Assessment: Post-op Vital signs reviewed, Patient's Cardiovascular Status Stable, Respiratory Function Stable, Patent Airway and No signs of Nausea or vomiting  Post-op Vital Signs: Reviewed and stable  Last Vitals:  Filed Vitals:   02/07/15 0555  BP: 142/68  Pulse: 61  Temp: 36.8 C  Resp: 16    Complications: No apparent anesthesia complications

## 2015-02-07 NOTE — Progress Notes (Signed)
Mindy Kelley is [age] y.o.  [id number]  Post Op Date # 1:  LAVH/BSO  Subjective: Patient is Doing well postoperatively. However, reports "contraction-like" left sided pelvic pain, near groin area. Patient has Pain is not well controlled.  Medications being used: prescription NSAID's including Ketorolac and narcotic analgesics including Vicodin. Has ambulated, tolerated regular diet   but has not voided since Foley removed less than an hour ago.     Objective: Vital signs in last 24 hours: Temp:  [97.5 F (36.4 C)-99.2 F (37.3 C)] 98.3 F (36.8 C) (02/17 0555) Pulse Rate:  [61-90] 61 (02/17 0555) Resp:  [11-16] 16 (02/17 0555) BP: (115-143)/(59-93) 142/68 mmHg (02/17 0555) SpO2:  [96 %-100 %] 100 % (02/17 0555) Weight:  [202 lb (91.627 kg)] 202 lb (91.627 kg) (02/16 1534)  Intake/Output from previous day: 02/16 0701 - 02/17 0700 In: 5497.1 [P.O.:1020; I.V.:4377.1] Out: 3500 [Urine:3300] Intake/Output this shift: Total I/O In: 2087.1 [P.O.:660; I.V.:1427.1] Out: 2550 [Urine:2550]  Recent Labs Lab 02/07/15 0520  WBC 9.2  HGB 11.6*  HCT 36.0  PLT 271    No results for input(s): NA, K, CL, CO2, BUN, CREATININE, CALCIUM, PROT, BILITOT, ALKPHOS, ALT, AST, GLUCOSE in the last 168 hours.  Invalid input(s): LABALBU  EXAM: General: alert, cooperative and mild distress Resp: clear to auscultation bilaterally Cardio: regular rate and rhythm, S1, S2 normal, no murmur, click, rub or gallop GI: Bowel sounds present, soft, incisions and dressing intact, clean and without evidence of infection. Extremities: No calf tenderness and negative Homan's Sign   Assessment: s/p Procedure(s): LAPAROSCOPIC ASSISTED VAGINAL HYSTERECTOMY, BILATERAL SALPINGO-OOPHORECTOMY, BIOPSY PELVIC PERITOMEUM: stable  Plan: Awaiting patient to void.  Advised  of sequelae associated with chronic pain and the plan to manage with NSAID, less potent narcotic and a neuromodulator  (Ibuprofen, Vicodin and  Amitriptyline respectively) Routine care and will plan to discharge home later today.  Addenduum:  Pain now seems well controlled.  Long discussion held with pt concerning appropriate expectations for post op pain, multiple components of chronic pain and how we will address each component.  Will add Flexeril to pt's regimen for prn use, but also explained that pain that is self limited and lasts less than 5 minutes may be a normal part of her healing process and will not require medication treatment.  Will encourage increased activity as appropriate to return pt to usual level of physical fitness as soon as advisable.     POWELL,ELMIRA, PA-C 02/07/2015 6:56 AM

## 2015-02-07 NOTE — Discharge Summary (Signed)
Physician Discharge Summary  Patient ID: Mindy EllisShameka Kelley MRN: [id number] DOB/AGE: 29/06/1986 29 y.o.  Admit date: 02/06/2015 Discharge date: 02/07/2015   Discharge Diagnoses:  Chronic Pelvic Pain, Severe Dysmenorrhea and Menorrhagia Active Problems:   Pain in joint, pelvic region and thigh   Abnormal uterine bleeding (AUB)   History of PCOS   Pelvic pain in female   Operation: Laparoscopically Assisted Vaginal Hysterectomy with Bilateral Salpingo-oophorectomy   Discharged Condition: Good  Hospital Course: On the date of admission the patient underwent the aforementioned procedures and tolerated them well.  Post operative course was unremarkable with the exception of pain management challenge.  By post operative day #1 the patient had resumed bowel and bladder function and was placed on a pain management  regimen of an NSAID, narcotic and neuromodulator and was therefore deemed ready for discharge home.  Discharge hemoglobin was 11.6.  Disposition: Home to self care  Discharge Medications:    Medication List    STOP taking these medications        HYDROmorphone 2 MG tablet  Commonly known as:  DILAUDID     ketorolac 10 MG tablet  Commonly known as:  TORADOL     norethindrone 5 MG tablet  Commonly known as:  AYGESTIN     norethindrone-ethinyl estradiol 1/35 tablet  Commonly known as:  ORTHO-NOVUM, NORTREL,CYCLAFEM      TAKE these medications        albuterol 108 (90 BASE) MCG/ACT inhaler  Commonly known as:  PROVENTIL HFA;VENTOLIN HFA  Inhale 2 puffs into the lungs every 6 (six) hours as needed for wheezing or shortness of breath.     amitriptyline 25 MG tablet  Commonly known as:  ELAVIL  Take 6 tablets (150 mg total) by mouth at bedtime.     guaiFENesin 600 MG 12 hr tablet  Commonly known as:  MUCINEX  Take 600 mg by mouth 2 (two) times daily.     HYDROcodone-acetaminophen 5-325 MG per tablet  Commonly known as:  NORCO/VICODIN  Take 1-2 tablets by mouth  every 4 (four) hours as needed for moderate pain.     ibuprofen 600 MG tablet  Commonly known as:  ADVIL,MOTRIN  1  po  pc every 6 hours for 5 days then as needed for pain     ondansetron 4 MG tablet  Commonly known as:  ZOFRAN  Take 1 tablet (4 mg total) by mouth every 8 (eight) hours as needed for nausea or vomiting.     triamcinolone 0.025 % cream  Commonly known as:  KENALOG  Apply 1 application topically daily.     valACYclovir 500 MG tablet  Commonly known as:  VALTREX  Take 500 mg by mouth daily.     vitamin C 500 MG tablet  Commonly known as:  ASCORBIC ACID  Take 500 mg by mouth daily.     Vitamin D (Ergocalciferol) 50000 UNITS Caps capsule  Commonly known as:  DRISDOL  Take 50,000 Units by mouth 2 (two) times a week.          Follow-up: Dr. Erie NoeVanessa P. Haygood on March 20, 2015 at 10:30 a.m.   SignedHenreitta Leber: Kaylamarie Swickard, PA-C 02/07/2015, 7:13 AM

## 2015-02-07 NOTE — Addendum Note (Signed)
Addendum  created 02/07/15 [id number] by Yolonda KidaAlison L Daniyla Pfahler, CRNA   Modules edited: Notes Section   Notes Section:  File: [id number]

## 2015-02-07 NOTE — Progress Notes (Signed)
Pt discharged to home with cousin.  Condition stable.  Pt to car via wheelchair with Dolphus Jenny. Riley, NT.  No equipment for home ordered at discharge.

## 2015-02-07 NOTE — Progress Notes (Signed)
Ur chart review completed.  

## 2016-10-08 DIAGNOSIS — N809 Endometriosis, unspecified: Secondary | ICD-10-CM | POA: Diagnosis not present

## 2016-10-08 DIAGNOSIS — N951 Menopausal and female climacteric states: Secondary | ICD-10-CM | POA: Diagnosis not present

## 2016-10-08 DIAGNOSIS — Z6838 Body mass index (BMI) 38.0-38.9, adult: Secondary | ICD-10-CM | POA: Diagnosis not present

## 2016-10-08 DIAGNOSIS — R102 Pelvic and perineal pain: Secondary | ICD-10-CM | POA: Diagnosis not present

## 2016-10-08 DIAGNOSIS — N898 Other specified noninflammatory disorders of vagina: Secondary | ICD-10-CM | POA: Diagnosis not present

## 2016-10-08 DIAGNOSIS — Z01419 Encounter for gynecological examination (general) (routine) without abnormal findings: Secondary | ICD-10-CM | POA: Diagnosis not present

## 2016-10-08 DIAGNOSIS — M797 Fibromyalgia: Secondary | ICD-10-CM | POA: Diagnosis not present

## 2016-10-08 DIAGNOSIS — M549 Dorsalgia, unspecified: Secondary | ICD-10-CM | POA: Diagnosis not present

## 2016-10-08 DIAGNOSIS — B009 Herpesviral infection, unspecified: Secondary | ICD-10-CM | POA: Diagnosis not present

## 2016-10-10 DIAGNOSIS — Z6837 Body mass index (BMI) 37.0-37.9, adult: Secondary | ICD-10-CM | POA: Diagnosis not present

## 2016-10-10 DIAGNOSIS — Z713 Dietary counseling and surveillance: Secondary | ICD-10-CM | POA: Diagnosis not present

## 2016-10-10 DIAGNOSIS — Z299 Encounter for prophylactic measures, unspecified: Secondary | ICD-10-CM | POA: Diagnosis not present

## 2016-10-10 DIAGNOSIS — Z90710 Acquired absence of both cervix and uterus: Secondary | ICD-10-CM | POA: Diagnosis not present

## 2016-10-10 DIAGNOSIS — M797 Fibromyalgia: Secondary | ICD-10-CM | POA: Diagnosis not present

## 2016-11-19 DIAGNOSIS — L309 Dermatitis, unspecified: Secondary | ICD-10-CM | POA: Diagnosis not present

## 2016-11-19 DIAGNOSIS — Z713 Dietary counseling and surveillance: Secondary | ICD-10-CM | POA: Diagnosis not present

## 2016-11-19 DIAGNOSIS — Z299 Encounter for prophylactic measures, unspecified: Secondary | ICD-10-CM | POA: Diagnosis not present

## 2016-11-19 DIAGNOSIS — Z6837 Body mass index (BMI) 37.0-37.9, adult: Secondary | ICD-10-CM | POA: Diagnosis not present

## 2017-02-13 ENCOUNTER — Encounter (HOSPITAL_COMMUNITY): Payer: Self-pay

## 2017-02-13 ENCOUNTER — Emergency Department (HOSPITAL_COMMUNITY)
Admission: EM | Admit: 2017-02-13 | Discharge: 2017-02-13 | Disposition: A | Discharge status: Home / Self Care | Payer: PRIVATE HEALTH INSURANCE | Attending: Emergency Medicine | Admitting: Emergency Medicine

## 2017-02-13 DIAGNOSIS — Z791 Long term (current) use of non-steroidal anti-inflammatories (NSAID): Secondary | ICD-10-CM | POA: Insufficient documentation

## 2017-02-13 DIAGNOSIS — T7840XA Allergy, unspecified, initial encounter: Secondary | ICD-10-CM | POA: Diagnosis not present

## 2017-02-13 DIAGNOSIS — R21 Rash and other nonspecific skin eruption: Secondary | ICD-10-CM | POA: Diagnosis present

## 2017-02-13 DIAGNOSIS — Z79899 Other long term (current) drug therapy: Secondary | ICD-10-CM | POA: Diagnosis not present

## 2017-02-13 MED ORDER — DIPHENHYDRAMINE HCL 25 MG PO CAPS
25.0000 mg | ORAL_CAPSULE | Freq: Once | ORAL | Status: AC
  Administered 2017-02-13: 25 mg via ORAL
  Filled 2017-02-13: qty 1

## 2017-02-13 MED ORDER — PREDNISONE 20 MG PO TABS: 40.0000 mg | ORAL_TABLET | Freq: Every day | ORAL | 0 refills | Status: DC

## 2017-02-13 MED ORDER — PREDNISONE 50 MG PO TABS
60.0000 mg | ORAL_TABLET | Freq: Once | ORAL | Status: AC
  Administered 2017-02-13: 60 mg via ORAL
  Filled 2017-02-13: qty 1

## 2017-02-13 NOTE — Discharge Instructions (Signed)
Continue taking benadryl one - two capsules every 4-6 hrs as needed for itching.  Start the prednisone tomorrow.  Return here for any worsening symptoms such as swelling, difficulty swallowing or breathing

## 2017-02-13 NOTE — ED Triage Notes (Signed)
Reports of using "Brutab" cleaning product and began to have hives and itching. Patient states they have subsided. Denies respiratry issues.

## 2017-02-15 NOTE — ED Provider Notes (Signed)
[facility] Provider Note   CSN: [id number] Arrival date & time: 02/13/17  1655     History   Chief Complaint Chief Complaint  Patient presents with  . Allergic Reaction    HPI Mindy Kelley is a 31 y.o. female.  HPI   Mindy Kelley is a 31 y.o. female who presents to the Emergency Department complaining of allergic reaction to a cleaning agent that she was using.  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  She noticed "red bumps" to her face and arms and itching.  She states that symptoms began to resolve after she stopped cleaning and washed herself with soap and water.  She states the bumps have cleared, but she continues to feel itchy.  She reports similar symptoms in the past while using other chemical agents.  She denies shortness of breath, difficulty swallowing or swelling to her face or body.  She has not taken Benadryl prior to arrival.     Past Medical History:  Diagnosis Date  . Bronchitis   . GERD (gastroesophageal reflux disease)   . Headache    migraines  . Insomnia   . Numbness and tingling of foot   . PCOS (polycystic ovarian syndrome)   . PONV (postoperative nausea and vomiting)    pt developed fever 105 returned back to hospital for admission  . Swelling of both ankles     Patient Active Problem List   Diagnosis Date Noted  . Abnormal uterine bleeding (AUB) 02/06/2015  . History of PCOS 02/06/2015  . Pelvic pain in female 02/06/2015    Past Surgical History:  Procedure Laterality Date  . CLEFT LIP REPAIR    . COLPOSCOPY    . LAPAROSCOPIC ASSISTED VAGINAL HYSTERECTOMY Bilateral 02/06/2015   Procedure: LAPAROSCOPIC ASSISTED VAGINAL HYSTERECTOMY, BILATERAL SALPINGO-OOPHORECTOMY, BIOPSY PELVIC PERITOMEUM;  Surgeon: Hal Morales, MD;  Location: [facility];  Service: Gynecology;  Laterality: Bilateral;  . PILONIDAL CYST EXCISION      OB History    No data  available       Home Medications    Prior to Admission medications   Medication Sig Start Date End Date Taking? Authorizing Provider  albuterol (PROVENTIL HFA;VENTOLIN HFA) 108 (90 BASE) MCG/ACT inhaler Inhale 2 puffs into the lungs every 6 (six) hours as needed for wheezing or shortness of breath.    Historical Provider, MD  amitriptyline (ELAVIL) 25 MG tablet Take 1 tablet (25 mg total) by mouth at bedtime. 02/07/15   Hal Morales, MD  cyclobenzaprine (FLEXERIL) 5 MG tablet 1-2 tablets every 8 hours as needed for persistent muscle contraction pain. 02/07/15   Hal Morales, MD  estradiol (VIVELLE-DOT) 0.0375 MG/24HR Place 1 patch onto the skin 2 (two) times a week. 02/07/15   Hal Morales, MD  guaiFENesin (MUCINEX) 600 MG 12 hr tablet Take 600 mg by mouth 2 (two) times daily.    Historical Provider, MD  HYDROcodone-acetaminophen (NORCO/VICODIN) 5-325 MG per tablet Take 1-2 tablets by mouth every 4 (four) hours as needed for moderate pain. 02/07/15   Henreitta Leber, PA-C  ibuprofen (ADVIL,MOTRIN) 600 MG tablet 1  po  pc every 6 hours for 5 days then as needed for pain 02/07/15   Henreitta Leber, PA-C  ondansetron (ZOFRAN) 4 MG tablet Take 1 tablet (4 mg total) by mouth every 8 (eight) hours as needed for nausea or vomiting. 02/07/15   Henreitta Leber, PA-C  predniSONE (DELTASONE)  20 MG tablet Take 2 tablets (40 mg total) by mouth daily. For 4 days 02/13/17   Myliyah Rebuck, PA-C  triamcinolone (KENALOG) 0.025 % cream Apply 1 application topically daily.    Historical Provider, MD  valACYclovir (VALTREX) 500 MG tablet Take 500 mg by mouth daily.    Historical Provider, MD  vitamin C (ASCORBIC ACID) 500 MG tablet Take 500 mg by mouth daily.    Historical Provider, MD  Vitamin D, Ergocalciferol, (DRISDOL) 50000 UNITS CAPS capsule Take 50,000 Units by mouth 2 (two) times a week.    Historical Provider, MD    Family History No family history on file.  Social History Social History    Substance Use Topics  . Smoking status: Never Smoker  . Smokeless tobacco: Never Used  . Alcohol use Yes     Comment: rare     Allergies   Latex; Mango flavor; and Metformin and related   Review of Systems Review of Systems  Constitutional: Negative for activity change, appetite change, chills and fever.  HENT: Negative for facial swelling, sore throat and trouble swallowing.   Respiratory: Negative for chest tightness, shortness of breath and wheezing.   Cardiovascular: Negative for chest pain.  Gastrointestinal: Negative for nausea and vomiting.  Musculoskeletal: Negative for neck pain and neck stiffness.  Skin: Positive for rash. Negative for wound.       Itching of the face and arms  Neurological: Negative for dizziness, weakness, numbness and headaches.  All other systems reviewed and are negative.    Physical Exam Updated Vital Signs BP 131/83 (BP Location: Left Arm)   Pulse 63   Temp 98.3 F (36.8 C) (Oral)   Resp 18   Ht 5\' 5"  (1.651 m)   Wt 104.3 kg   LMP 12/02/2014 (Exact Date)   SpO2 100%   BMI 38.27 kg/m   Physical Exam  Constitutional: She is oriented to person, place, and time. She appears well-developed and well-nourished. No distress.  HENT:  Head: Normocephalic and atraumatic.  Mouth/Throat: Uvula is midline, oropharynx is clear and moist and mucous membranes are normal. No oral lesions. No uvula swelling. No posterior oropharyngeal edema.  Airway patent, no edema of the tongue or lips  Neck: Normal range of motion. Neck supple.  Cardiovascular: Normal rate, regular rhythm and intact distal pulses.   No murmur heard. Pulmonary/Chest: Effort normal and breath sounds normal. No respiratory distress.  Musculoskeletal: She exhibits no edema or tenderness.  Lymphadenopathy:    She has no cervical adenopathy.  Neurological: She is alert and oriented to person, place, and time. She exhibits normal muscle tone. Coordination normal.  Skin: Skin is  warm. No rash noted. No erythema.  Mild dermographia of the bilateral forearms.  No edema or hives.  Nursing note and vitals reviewed.    ED Treatments / Results  Labs (all labs ordered are listed, but only abnormal results are displayed) Labs Reviewed - No data to display  EKG  EKG Interpretation None       Radiology No results found.  Procedures Procedures (including critical care time)  Medications Ordered in ED Medications  predniSONE (DELTASONE) tablet 60 mg (60 mg Oral Given 02/13/17 1758)  diphenhydrAMINE (BENADRYL) capsule 25 mg (25 mg Oral Given 02/13/17 1758)     Initial Impression / Assessment and Plan / ED Course  I have reviewed the triage vital signs and the nursing notes.  Pertinent labs & imaging results that were available during my care of the patient  were reviewed by me and considered in my medical decision making (see chart for details).     Pt well appearing.  Sx's resolving prior to arrival.  No edema or airway compromise on exam.  Pt feeling better after bendaryl.  No hives on exam.  She agrees to OTC benadryl for few days and rx for prednisone.  She appears stable for d/c   Final Clinical Impressions(s) / ED Diagnoses   Final diagnoses:  Allergic reaction, initial encounter    New Prescriptions Discharge Medication List as of 02/13/2017  6:45 PM    START taking these medications   Details  predniSONE (DELTASONE) 20 MG tablet Take 2 tablets (40 mg total) by mouth daily. For 4 days, Starting Fri 02/13/2017, Print         Zineb Glade [place], PA-C 02/15/17 1631    Samuel Jester, DO 02/16/17 [phone]

## 2017-04-08 DIAGNOSIS — N951 Menopausal and female climacteric states: Secondary | ICD-10-CM | POA: Diagnosis not present

## 2017-04-08 DIAGNOSIS — Z0000 Encounter for general adult medical examination without abnormal findings: Secondary | ICD-10-CM | POA: Diagnosis not present

## 2017-04-08 DIAGNOSIS — E559 Vitamin D deficiency, unspecified: Secondary | ICD-10-CM | POA: Diagnosis not present

## 2017-04-08 DIAGNOSIS — R5383 Other fatigue: Secondary | ICD-10-CM | POA: Diagnosis not present

## 2017-04-08 DIAGNOSIS — R6882 Decreased libido: Secondary | ICD-10-CM | POA: Diagnosis not present

## 2017-04-08 DIAGNOSIS — Z0389 Encounter for observation for other suspected diseases and conditions ruled out: Secondary | ICD-10-CM | POA: Diagnosis not present

## 2017-05-05 ENCOUNTER — Encounter: Payer: Self-pay | Admitting: Internal Medicine

## 2017-05-05 DIAGNOSIS — R5383 Other fatigue: Secondary | ICD-10-CM | POA: Diagnosis not present

## 2017-05-05 DIAGNOSIS — N951 Menopausal and female climacteric states: Secondary | ICD-10-CM | POA: Diagnosis not present

## 2017-05-05 DIAGNOSIS — E559 Vitamin D deficiency, unspecified: Secondary | ICD-10-CM | POA: Diagnosis not present

## 2017-05-05 DIAGNOSIS — R6882 Decreased libido: Secondary | ICD-10-CM | POA: Diagnosis not present

## 2017-05-18 ENCOUNTER — Emergency Department (HOSPITAL_COMMUNITY)
Admission: EM | Admit: 2017-05-18 | Discharge: 2017-05-18 | Disposition: A | Discharge status: Home / Self Care | Payer: 59 | Attending: Emergency Medicine | Admitting: Emergency Medicine

## 2017-05-18 ENCOUNTER — Encounter (HOSPITAL_COMMUNITY): Payer: Self-pay | Admitting: Emergency Medicine

## 2017-05-18 DIAGNOSIS — S90861A Insect bite (nonvenomous), right foot, initial encounter: Secondary | ICD-10-CM | POA: Diagnosis not present

## 2017-05-18 DIAGNOSIS — S90464A Insect bite (nonvenomous), right lesser toe(s), initial encounter: Secondary | ICD-10-CM | POA: Insufficient documentation

## 2017-05-18 DIAGNOSIS — Y929 Unspecified place or not applicable: Secondary | ICD-10-CM | POA: Insufficient documentation

## 2017-05-18 DIAGNOSIS — W57XXXA Bitten or stung by nonvenomous insect and other nonvenomous arthropods, initial encounter: Secondary | ICD-10-CM | POA: Diagnosis not present

## 2017-05-18 DIAGNOSIS — R51 Headache: Secondary | ICD-10-CM | POA: Diagnosis not present

## 2017-05-18 DIAGNOSIS — Y999 Unspecified external cause status: Secondary | ICD-10-CM | POA: Diagnosis not present

## 2017-05-18 DIAGNOSIS — Y939 Activity, unspecified: Secondary | ICD-10-CM | POA: Diagnosis not present

## 2017-05-18 DIAGNOSIS — S99921A Unspecified injury of right foot, initial encounter: Secondary | ICD-10-CM | POA: Diagnosis present

## 2017-05-18 MED ORDER — DOXYCYCLINE HYCLATE 100 MG PO CAPS: 100.0000 mg | ORAL_CAPSULE | Freq: Two times a day (BID) | ORAL | 0 refills | Status: DC

## 2017-05-18 MED ORDER — DOXYCYCLINE HYCLATE 100 MG PO TABS
100.0000 mg | ORAL_TABLET | Freq: Once | ORAL | Status: AC
  Administered 2017-05-18: 100 mg via ORAL
  Filled 2017-05-18: qty 1

## 2017-05-18 MED ORDER — LORATADINE 10 MG PO TABS
10.0000 mg | ORAL_TABLET | Freq: Once | ORAL | Status: AC
  Administered 2017-05-18: 10 mg via ORAL
  Filled 2017-05-18: qty 1

## 2017-05-18 MED ORDER — DEXAMETHASONE 4 MG PO TABS: 4.0000 mg | ORAL_TABLET | Freq: Two times a day (BID) | ORAL | 0 refills | Status: DC

## 2017-05-18 MED ORDER — PREDNISONE 20 MG PO TABS
40.0000 mg | ORAL_TABLET | Freq: Once | ORAL | Status: AC
  Administered 2017-05-18: 40 mg via ORAL
  Filled 2017-05-18: qty 2

## 2017-05-18 NOTE — Discharge Instructions (Signed)
Your vital signs within normal limits. Your oxygen level is 96% on room air. Within normal limits by my interpretation. You have a local reaction at the bite area. Please rest your foot on an ice pack and elevate your foot when possible. Use Allegra during the day, use your anti-itch medication her Benadryl at bedtime. Use doxycycline and Decadron 2 times daily with a meal. Please see Dr. Karilyn CotaGosrani or return to the emergency department if any changes, problems, or concerns.

## 2017-05-18 NOTE — ED Notes (Signed)
Pt ambulatory to waiting room. Pt verbalized understanding of discharge instructions.   

## 2017-05-18 NOTE — ED Triage Notes (Signed)
Pt C/O tick bit between the 2nd and 3rd toe on the right foot. Pt also C/O HA and no fever.

## 2017-05-18 NOTE — ED Provider Notes (Signed)
[facility] Provider Note   CSN: [id number] Arrival date & time: 05/18/17  0018     History   Chief Complaint Chief Complaint  Patient presents with  . Insect Bite    HPI Mindy Kelley is a 31 y.o. female.  Patient is a 31 year old female who presents to the emergency department following an insect bite to the foot.  The patient states that she was sitting at her job when she felt something biting her foot. She was able to pull it off, it was red, she is unsure if it was a tick or some other type of insect. Shortly after the bite she noticed that the inner aspect of her second toe on the right foot was swelling. She then had a stinging. And this was followed by numbness of the area. There was no excessive bleeding reported. The patient denies any anaphylactic type symptoms. She is able to walk on the foot, but states that the area feels tight and swollen, and at times numb.   The history is provided by the patient.    Past Medical History:  Diagnosis Date  . Bronchitis   . GERD (gastroesophageal reflux disease)   . Headache    migraines  . Insomnia   . Numbness and tingling of foot   . PCOS (polycystic ovarian syndrome)   . PONV (postoperative nausea and vomiting)    pt developed fever 105 returned back to hospital for admission  . Swelling of both ankles     Patient Active Problem List   Diagnosis Date Noted  . Abnormal uterine bleeding (AUB) 02/06/2015  . History of PCOS 02/06/2015  . Pelvic pain in female 02/06/2015    Past Surgical History:  Procedure Laterality Date  . CLEFT LIP REPAIR    . COLPOSCOPY    . LAPAROSCOPIC ASSISTED VAGINAL HYSTERECTOMY Bilateral 02/06/2015   Procedure: LAPAROSCOPIC ASSISTED VAGINAL HYSTERECTOMY, BILATERAL SALPINGO-OOPHORECTOMY, BIOPSY PELVIC PERITOMEUM;  Surgeon: Hal MoralesVanessa P Haygood, MD;  Location: [facility];  Service: Gynecology;  Laterality: Bilateral;  . PILONIDAL CYST EXCISION      OB History    No data available         Home Medications    Prior to Admission medications   Medication Sig Start Date End Date Taking? Authorizing Provider  albuterol (PROVENTIL HFA;VENTOLIN HFA) 108 (90 BASE) MCG/ACT inhaler Inhale 2 puffs into the lungs every 6 (six) hours as needed for wheezing or shortness of breath.    [provider]  amitriptyline (ELAVIL) 25 MG tablet Take 1 tablet (25 mg total) by mouth at bedtime. 02/07/15   Haygood, Maris BergerVanessa P, MD  cyclobenzaprine (FLEXERIL) 5 MG tablet 1-2 tablets every 8 hours as needed for persistent muscle contraction pain. 02/07/15   Haygood, Maris BergerVanessa P, MD  estradiol (VIVELLE-DOT) 0.0375 MG/24HR Place 1 patch onto the skin 2 (two) times a week. 02/07/15   Haygood, Maris BergerVanessa P, MD  guaiFENesin (MUCINEX) 600 MG 12 hr tablet Take 600 mg by mouth 2 (two) times daily.    [provider]  HYDROcodone-acetaminophen (NORCO/VICODIN) 5-325 MG per tablet Take 1-2 tablets by mouth every 4 (four) hours as needed for moderate pain. 02/07/15   Henreitta LeberPowell, [place], PA-C  ibuprofen (ADVIL,MOTRIN) 600 MG tablet 1  po  pc every 6 hours for 5 days then as needed for pain 02/07/15   Henreitta LeberPowell, [place], PA-C  ondansetron (ZOFRAN) 4 MG tablet Take 1 tablet (4 mg total) by mouth every 8 (eight) hours as needed for nausea or vomiting. 02/07/15  Henreitta Leber, PA-C  predniSONE (DELTASONE) 20 MG tablet Take 2 tablets (40 mg total) by mouth daily. For 4 days 02/13/17   Triplett, Tammy, PA-C  triamcinolone (KENALOG) 0.025 % cream Apply 1 application topically daily.    [provider]  valACYclovir (VALTREX) 500 MG tablet Take 500 mg by mouth daily.    [provider]  vitamin C (ASCORBIC ACID) 500 MG tablet Take 500 mg by mouth daily.    [provider]  Vitamin D, Ergocalciferol, (DRISDOL) 50000 UNITS CAPS capsule Take 50,000 Units by mouth 2 (two) times a week.    [provider]    Family History No family history on file.  Social History Social History   Substance Use Topics  . Smoking status: Never Smoker  . Smokeless tobacco: Never Used  . Alcohol use Yes     Comment: rare     Allergies   Latex; Mango flavor; and Metformin and related   Review of Systems Review of Systems  Constitutional: Negative for activity change and appetite change.  HENT: Negative for congestion, ear discharge, ear pain, facial swelling, nosebleeds, rhinorrhea, sneezing and tinnitus.   Eyes: Negative for photophobia, pain and discharge.  Respiratory: Negative for cough, choking, shortness of breath and wheezing.   Cardiovascular: Negative for chest pain, palpitations and leg swelling.  Gastrointestinal: Negative for abdominal pain, blood in stool, constipation, diarrhea, nausea and vomiting.  Genitourinary: Negative for difficulty urinating, dysuria, flank pain, frequency and hematuria.  Musculoskeletal: Negative for back pain, gait problem, myalgias and neck pain.  Skin: Negative for color change, rash and wound.       Insect bite  Neurological: Positive for headaches. Negative for dizziness, seizures, syncope, facial asymmetry, speech difficulty, weakness and numbness.  Hematological: Negative for adenopathy. Does not bruise/bleed easily.  Psychiatric/Behavioral: Negative for agitation, confusion, hallucinations, self-injury and suicidal ideas. The patient is not nervous/anxious.      Physical Exam Updated Vital Signs BP 125/87 (BP Location: Left Arm)   Pulse 71   Temp 98.6 F (37 C) (Oral)   Resp 16   Ht 5\' 5"  (1.651 m)   Wt 104.3 kg (230 lb)   LMP 12/02/2014 (Exact Date)   SpO2 96%   BMI 38.27 kg/m   Physical Exam  Constitutional: She is oriented to person, place, and time. She appears well-developed and well-nourished.  Non-toxic appearance.  HENT:  Head: Normocephalic.  Right Ear: Tympanic membrane and external ear normal.  Left Ear: Tympanic membrane and external ear normal.  Eyes: EOM and lids are normal. Pupils are equal, round,  and reactive to light.  Neck: Normal range of motion. Neck supple. Carotid bruit is not present.  Cardiovascular: Normal rate, regular rhythm, normal heart sounds, intact distal pulses and normal pulses.   Pulmonary/Chest: Breath sounds normal. No respiratory distress. She has no wheezes. She has no rales. She exhibits no tenderness.  Abdominal: Soft. Bowel sounds are normal. There is no tenderness. There is no guarding.  Musculoskeletal: Normal range of motion.  The base of the right second toe is swollen. There is mild swelling of the web space between the second and third toe of the right foot. There is no puncture wound noted to the plantar surface of the right foot. There is a scab at what is believed to have been the bite area on the inner aspect of the right second toe. There no red streaks going up the leg. There no temperature changes appreciated of the right lower extremity.  There is good range of motion of all other toes on the right foot.  Lymphadenopathy:       Head (right side): No submandibular adenopathy present.       Head (left side): No submandibular adenopathy present.    She has no cervical adenopathy.  Neurological: She is alert and oriented to person, place, and time. She has normal strength. No cranial nerve deficit or sensory deficit.  Skin: Skin is warm and dry.  Psychiatric: She has a normal mood and affect. Her speech is normal.  Nursing note and vitals reviewed.    ED Treatments / Results  Labs (all labs ordered are listed, but only abnormal results are displayed) Labs Reviewed - No data to display  EKG  EKG Interpretation None       Radiology No results found.  Procedures Procedures (including critical care time)  Medications Ordered in ED Medications  doxycycline (VIBRA-TABS) tablet 100 mg (not administered)  predniSONE (DELTASONE) tablet 40 mg (not administered)  loratadine (CLARITIN) tablet 10 mg (not administered)     Initial Impression /  Assessment and Plan / ED Course  I have reviewed the triage vital signs and the nursing notes.  Pertinent labs & imaging results that were available during my care of the patient were reviewed by me and considered in my medical decision making (see chart for details).       Final Clinical Impressions(s) / ED Diagnose MDM Vital signs within normal limits. Pulse oximetry is 96% on room air. Within normal limits by my interpretation. The bite area was investigated under magnification. There no foreign body or insect parts noted at the scabbed area between the right second and third toe. There is moderate swelling at the base of the toe. But no red streaks appreciated drainage present. The patient will be treated with steroid medication a antibiotic, nondrowsy antihistamine during the day, and Benadryl or other histamine medication at bedtime. The patient will see her primary physician or return to the emergency department if not improving. The patient is in agreement with this plan.    Final diagnoses:  Insect bite, initial encounter    New Prescriptions New Prescriptions   DEXAMETHASONE (DECADRON) 4 MG TABLET    Take 1 tablet (4 mg total) by mouth 2 (two) times daily with a meal.   DOXYCYCLINE (VIBRAMYCIN) 100 MG CAPSULE    Take 1 capsule (100 mg total) by mouth 2 (two) times daily.     Ivery Quale, PA-C 05/18/17 0109    Glynn Octave, MD 05/18/17 [phone]

## 2017-05-27 ENCOUNTER — Encounter: Payer: Self-pay | Admitting: Internal Medicine

## 2017-05-27 DIAGNOSIS — R6882 Decreased libido: Secondary | ICD-10-CM | POA: Diagnosis not present

## 2017-05-27 DIAGNOSIS — E559 Vitamin D deficiency, unspecified: Secondary | ICD-10-CM | POA: Diagnosis not present

## 2017-05-27 DIAGNOSIS — N951 Menopausal and female climacteric states: Secondary | ICD-10-CM | POA: Diagnosis not present

## 2017-06-11 DIAGNOSIS — E282 Polycystic ovarian syndrome: Secondary | ICD-10-CM | POA: Diagnosis not present

## 2017-06-11 DIAGNOSIS — T7840XA Allergy, unspecified, initial encounter: Secondary | ICD-10-CM | POA: Diagnosis not present

## 2017-06-12 ENCOUNTER — Encounter: Payer: Self-pay | Admitting: Neurology

## 2017-06-12 ENCOUNTER — Ambulatory Visit (INDEPENDENT_AMBULATORY_CARE_PROVIDER_SITE_OTHER): Payer: 59 | Admitting: Neurology

## 2017-06-12 VITALS — BP 120/80 | HR 79 | Ht 65.0 in | Wt 234.2 lb

## 2017-06-12 DIAGNOSIS — R209 Unspecified disturbances of skin sensation: Secondary | ICD-10-CM

## 2017-06-12 DIAGNOSIS — M2550 Pain in unspecified joint: Secondary | ICD-10-CM

## 2017-06-12 DIAGNOSIS — R202 Paresthesia of skin: Secondary | ICD-10-CM | POA: Diagnosis not present

## 2017-06-12 NOTE — Progress Notes (Signed)
[facility] Clinic Note - Initial Visit   Date: 06/12/17  Mindy Kelley MRN: [id number] DOB: 01/28/1986   Dear Dr. Karilyn Cota:  Thank you for your kind referral of Mindy Kelley for consultation of hand paresthesias. Although her history is well known to you, please allow [place] to reiterate it for the purpose of our medical record. The patient was accompanied to the clinic by self.   History of Present Illness: Mindy Kelley is a 31 y.o. right-handed female with fibromyalgia, PCOS, endometriosis s/p total hysterectomy, vitamin D deficiency referred for evaluation of hand paresthesias, however patient had many other complaints including generalized pain and left foot pain.    Starting around 2011, she started having swelling, cold, numbness, and shooting pain over the left lateral foot.  She had CT, MRI, x-ray of the foot which normal.  She was recommended to wear compression socks for her swelling.  In 2016, she had hysterectomy for endometriosis and lateral she started having generalized pain and weakness.  She is dropping objects, she feels as if her hands are locking, and complains of swelling and achy joint pain and in the wrists.  She also has shooting pain which radiates up her forearm in both arms.  A few weeks ago, she developed what felt like left sided face drooping, which lasted two days.   She has seen a neurologist previously and had NCS/EMG of the left leg and MRI brain which was normal.  She was then referred to rheumatology whose evaluation for autoimmune disease was negative and ultimately diagnosed with fibromyalgia for which she takes Cymbalta and flexeril.  She feels that since her last evaluation by a neurologist, all of her symptoms are getting worse and is requesting another work-up.  She does not have any of her prior studies for me to review which was done when her ex-husband was in the [employer] and at various different locations.     Past  Medical History:  Diagnosis Date  . Bronchitis   . Cleft lip   . GERD (gastroesophageal reflux disease)   . Headache    migraines  . Insomnia   . Numbness and tingling of foot   . PCOS (polycystic ovarian syndrome)   . PONV (postoperative nausea and vomiting)    pt developed fever 105 returned back to hospital for admission  . Swelling of both ankles     Past Surgical History:  Procedure Laterality Date  . CLEFT LIP REPAIR    . COLPOSCOPY    . LAPAROSCOPIC ASSISTED VAGINAL HYSTERECTOMY Bilateral 02/06/2015   Procedure: LAPAROSCOPIC ASSISTED VAGINAL HYSTERECTOMY, BILATERAL SALPINGO-OOPHORECTOMY, BIOPSY PELVIC PERITOMEUM;  Surgeon: Hal Morales, MD;  Location: [facility];  Service: Gynecology;  Laterality: Bilateral;  . PILONIDAL CYST EXCISION       Medications:  Outpatient Encounter Prescriptions as of 06/12/2017  Medication Sig  . albuterol (PROVENTIL HFA;VENTOLIN HFA) 108 (90 BASE) MCG/ACT inhaler Inhale 2 puffs into the lungs every 6 (six) hours as needed for wheezing or shortness of breath.  . cyclobenzaprine (FLEXERIL) 5 MG tablet 1-2 tablets every 8 hours as needed for persistent muscle contraction pain.  Marland Kitchen dexamethasone (DECADRON) 4 MG tablet Take 1 tablet (4 mg total) by mouth 2 (two) times daily with a meal.  . DULoxetine (CYMBALTA) 60 MG capsule Take 60 mg by mouth daily.  Marland Kitchen estradiol (VIVELLE-DOT) 0.0375 MG/24HR Place 1 patch onto the skin 2 (two) times a week.  . hydrOXYzine (ATARAX/VISTARIL) 25 MG tablet Take 25 mg by mouth  3 (three) times daily as needed.  . loratadine (CLARITIN) 10 MG tablet Take 10 mg by mouth daily.  Marland Kitchen omeprazole (PRILOSEC) 20 MG capsule Take 20 mg by mouth daily.  Marland Kitchen triamcinolone (KENALOG) 0.025 % cream Apply 1 application topically daily.  . vitamin C (ASCORBIC ACID) 500 MG tablet Take 500 mg by mouth daily.  . Vitamin D, Ergocalciferol, (DRISDOL) 50000 UNITS CAPS capsule Take 50,000 Units by mouth 2 (two) times a week.  . [DISCONTINUED]  amitriptyline (ELAVIL) 25 MG tablet Take 1 tablet (25 mg total) by mouth at bedtime.  . [DISCONTINUED] doxycycline (VIBRAMYCIN) 100 MG capsule Take 1 capsule (100 mg total) by mouth 2 (two) times daily.  . [DISCONTINUED] guaiFENesin (MUCINEX) 600 MG 12 hr tablet Take 600 mg by mouth 2 (two) times daily.  . [DISCONTINUED] HYDROcodone-acetaminophen (NORCO/VICODIN) 5-325 MG per tablet Take 1-2 tablets by mouth every 4 (four) hours as needed for moderate pain.  . [DISCONTINUED] ibuprofen (ADVIL,MOTRIN) 600 MG tablet 1  po  pc every 6 hours for 5 days then as needed for pain  . [DISCONTINUED] ondansetron (ZOFRAN) 4 MG tablet Take 1 tablet (4 mg total) by mouth every 8 (eight) hours as needed for nausea or vomiting.  . [DISCONTINUED] predniSONE (DELTASONE) 20 MG tablet Take 2 tablets (40 mg total) by mouth daily. For 4 days  . [DISCONTINUED] valACYclovir (VALTREX) 500 MG tablet Take 500 mg by mouth daily.   No facility-administered encounter medications on file as of 06/12/2017.      Allergies:  Allergies  Allergen Reactions  . Latex Hives  . Mango Flavor Hives, Itching and Swelling    Swelling is of the lips and tongue.  . Metformin And Related Nausea And Vomiting    Family History: Family History  Problem Relation Age of Onset  . Transient ischemic attack Mother   . Healthy Father   . Seizures Brother     Social History: Social History  Substance Use Topics  . Smoking status: Never Smoker  . Smokeless tobacco: Never Used  . Alcohol use Yes     Comment: rare   Social History   Social History Narrative   Lives with best friend in a one story home.  No children.  Works in Public affairs consultant at [employer].  Education: high school.     Review of Systems:  CONSTITUTIONAL: No fevers, chills, night sweats, or weight loss.   EYES: No visual changes or eye pain ENT: No hearing changes.  No history of nose bleeds.   RESPIRATORY: No cough, wheezing and shortness of breath.     CARDIOVASCULAR: Negative for chest pain, and palpitations.   GI: Negative for abdominal discomfort, blood in stools or black stools.  No recent change in bowel habits.   GU:  No history of incontinence.   MUSCLOSKELETAL: +history of joint pain +swelling.  No myalgias.   SKIN: Negative for lesions, rash, and itching.   HEMATOLOGY/ONCOLOGY: Negative for prolonged bleeding, bruising easily, and swollen nodes.  No history of cancer.   ENDOCRINE: Negative for cold or heat intolerance, polydipsia or goiter.   PSYCH:  No depression or anxiety symptoms.   NEURO: As Above.   Vital Signs:  BP 120/80   Pulse 79   Ht 5\' 5"  (1.651 m)   Wt 234 lb 3 oz (106.2 kg)   LMP 12/02/2014 (Exact Date)   SpO2 98%   BMI 38.97 kg/m    General Medical Exam:   General:  Well appearing, comfortable.  Eyes/ENT: see cranial nerve examination.  Old scar from previous cleft lip surgery Neck: No masses appreciated.  Full range of motion without tenderness.  No carotid bruits. Respiratory:  Clear to auscultation, good air entry bilaterally.   Cardiac:  Regular rate and rhythm, no murmur.   Extremities:  No deformities, edema, or skin discoloration.  Skin:  No rashes or lesions.  Neurological Exam: MENTAL STATUS including orientation to time, place, person, recent and remote memory, attention span and concentration, language, and fund of knowledge is normal.  Speech is not dysarthric.  CRANIAL NERVES: II:  No visual field defects.  Unremarkable fundi.   III-IV-VI: Pupils equal round and reactive to light.  Normal conjugate, extra-ocular eye movements in all directions of gaze.  No nystagmus.  No ptosis.   V:  Normal facial sensation.     VII:  Normal facial symmetry and movements.  VIII:  Normal hearing and vestibular function.   IX-X:  Normal palatal movement.   XI:  Normal shoulder shrug and head rotation.   XII:  Normal tongue strength and range of motion, no deviation or fasciculation.  MOTOR:  No  atrophy, fasciculations or abnormal movements.  No pronator drift.  Tone is normal.    Right Upper Extremity:    Left Upper Extremity:    Deltoid  5/5   Deltoid  5/5   Biceps  5/5   Biceps  5/5   Triceps  5/5   Triceps  5/5   Wrist extensors  5/5   Wrist extensors  5/5   Wrist flexors  5/5   Wrist flexors  5/5   Finger extensors  5/5   Finger extensors  5/5   Finger flexors  5/5   Finger flexors  5/5   Dorsal interossei  5/5   Dorsal interossei  5/5   Abductor pollicis  5/5   Abductor pollicis  5/5   Tone (Ashworth scale)  0  Tone (Ashworth scale)  0   Right Lower Extremity:    Left Lower Extremity:    Hip flexors  5/5   Hip flexors  5/5   Hip extensors  5/5   Hip extensors  5/5   Knee flexors  5/5   Knee flexors  5/5   Knee extensors  5/5   Knee extensors  5/5   Dorsiflexors  5/5   Dorsiflexors  5/5   Plantarflexors  5/5   Plantarflexors  5/5   Toe extensors  5/5   Toe extensors  5/5   Toe flexors  5/5   Toe flexors  5/5   Tone (Ashworth scale)  0  Tone (Ashworth scale)  0   MSRs:  Right                                                                 Left brachioradialis 2+  brachioradialis 2+  biceps 2+  biceps 2+  triceps 2+  triceps 2+  patellar 2+  patellar 2+  ankle jerk 2+  ankle jerk 2+  Hoffman no  Hoffman no  plantar response down  plantar response down   SENSORY:  There is a patchy area of reduced temperature and pin prick over the lateral left lower leg.  Otherwise, normal and symmetric perception of light touch,  pinprick, vibration, and proprioception.  Romberg's sign absent.   COORDINATION/GAIT: Normal finger-to- nose-finger and heel-to-shin.  Intact rapid alternating movements bilaterally.  Able to rise from a chair without using arms.  Gait narrow based and stable. Tandem and stressed gait intact.    IMPRESSION: Ms. Annice NeedyGauldin is a 31 year-old female referred for evaluation of bilateral hand parethesias, but also complains about generalized pain, polyarthralgias,  swelling, and left leg/foot pain.  It was explained that all of her complaints are not neurological (swelling, joint pain) and that I would do my best to address the ones specific to neurology.  Her exam is entirely normal and only notable for subjective patchy sensory loss over the left lateral leg which does not clearly follow a nerve distribution.  She reports having normal MRI brain and NCS/EMG in the past but because symptoms are worsening, would like to be retested.  With her normal exam, my overall suspicion for anything worrisome is low.  I will go ahead order MRI brain wwo contrast and NCS/EMG of the left arm and leg. I informed patient that even if she did have mild carpal tunnel syndrome, her forearm parestheisas and joint pain cannot be explained by this.  Therefore, I do think that her fibromyalgia is playing a greater role in her overall pain presentation.   Further recommendations will be based on her testing.  The duration of this appointment visit was 35 minutes of face-to-face time with the patient.  Greater than 50% of this time was spent in counseling, explanation of diagnosis, planning of further management, and coordination of care.   Thank you for allowing me to participate in patient's care.  If I can answer any additional questions, I would be pleased to do so.    Sincerely,    Donika K. Allena KatzPatel, DO

## 2017-06-12 NOTE — Patient Instructions (Signed)
MRI brain wwo contrast NCS/EMG of the left arm and leg  We will call you with the results

## 2017-06-19 ENCOUNTER — Ambulatory Visit (HOSPITAL_COMMUNITY): Payer: 59

## 2017-07-02 DIAGNOSIS — R6882 Decreased libido: Secondary | ICD-10-CM | POA: Diagnosis not present

## 2017-07-02 DIAGNOSIS — N951 Menopausal and female climacteric states: Secondary | ICD-10-CM | POA: Diagnosis not present

## 2017-07-02 DIAGNOSIS — E282 Polycystic ovarian syndrome: Secondary | ICD-10-CM | POA: Diagnosis not present

## 2017-07-10 ENCOUNTER — Telehealth: Payer: Self-pay | Admitting: Internal Medicine

## 2017-07-10 ENCOUNTER — Ambulatory Visit (HOSPITAL_COMMUNITY)
Admission: RE | Admit: 2017-07-10 | Discharge: 2017-07-10 | Disposition: A | Discharge status: Home / Self Care | Payer: 59 | Source: Ambulatory Visit | Attending: Neurology | Admitting: Neurology

## 2017-07-10 ENCOUNTER — Telehealth: Payer: Self-pay | Admitting: *Deleted

## 2017-07-10 DIAGNOSIS — M2550 Pain in unspecified joint: Secondary | ICD-10-CM | POA: Diagnosis not present

## 2017-07-10 DIAGNOSIS — R202 Paresthesia of skin: Secondary | ICD-10-CM | POA: Diagnosis not present

## 2017-07-10 MED ORDER — GADOBENATE DIMEGLUMINE 529 MG/ML IV SOLN
20.0000 mL | Freq: Once | INTRAVENOUS | Status: AC | PRN
  Administered 2017-07-10: 20 mL via INTRAVENOUS

## 2017-07-10 NOTE — Telephone Encounter (Signed)
Patient notified

## 2017-07-10 NOTE — Telephone Encounter (Signed)
-----   Message from Van ClinesKaren M Aquino, MD sent at 07/10/2017  9:48 AM EDT ----- Pls let her know MRI brain looks fine, no evidence of tumor, stroke, or bleed. Thanks

## 2017-07-10 NOTE — Telephone Encounter (Signed)
Please update on referral.  Thank you,  -LL

## 2017-07-21 ENCOUNTER — Ambulatory Visit (INDEPENDENT_AMBULATORY_CARE_PROVIDER_SITE_OTHER): Payer: 59 | Admitting: Neurology

## 2017-07-21 DIAGNOSIS — R202 Paresthesia of skin: Secondary | ICD-10-CM | POA: Diagnosis not present

## 2017-07-21 DIAGNOSIS — M2550 Pain in unspecified joint: Secondary | ICD-10-CM

## 2017-07-21 NOTE — Procedures (Signed)
[facility]  [address], [address]  [place], [place] [address] Tel: [phone] Fax:  [phone] Test Date:  07/21/2017  Patient: Mindy QuestShameka Kelley DOB: 11/05/1986 Physician: Mindy Kelley Lanika Colgate, DO  Sex: Female Height: 5\' 5"  Ref Phys: Mindy Kelley Anayia Eugene, DO  ID#: [id number] Temp: 34.0C Technician:    Patient Complaints: This is a 31 year-old female referred for evaluation of left forearm and lower leg paresthesias and pain.  NCV & EMG Findings: Extensive electrodiagnostic testing of the left upper and lower extremity shows:  1. Left median, ulnar, radial, and mixed palmer sensory responses are within normal limits. 2. Left median motor response shows reduced amplitude, however there is evidence of anomalous innervation to the abductor pollicis brevis, as noted by a motor response when stimulating at the ulnar wrist, consistent with a Martin-Gruber anastomosis. Left ulnar, peroneal, and tibial motor responses are within normal limits. 3. There is no evidence of active or chronic motor axon loss changes affecting any of the tested muscles. Variable firing pattern is seen in the flexor digitorum longus and medial gastrocnemius muscles due to pain, and otherwise normal motor unit recruitment throughout.   Impression: This is a normal study of the left upper and lower extremities. In particular, there is no evidence of a sensorimotor polyneuropathy, cervical/lumbosacral radiculopathy, or carpal tunnel syndrome.  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   ___________________________ Mindy Kelley Jennene Downie, DO    Nerve Conduction Studies Anti Sensory Summary Table   Site NR Peak (ms) Norm Peak (ms) P-T Amp (V) Norm P-T Amp  Left Median Anti Sensory (2nd Digit)  34C  Wrist    2.8 <3.4 59.9 >20  Left Radial Anti Sensory (Base 1st Digit)  34C  Wrist    2.3 <2.7 42.5 >18  Left Sup Peroneal Anti Sensory (Ant Lat Mall)  34C  12 cm    2.8 <4.5 13.1 >5  Left  Sural Anti Sensory (Lat Mall)  34C  Calf    4.0 <4.5 26.3 >5  Left Ulnar Anti Sensory (5th Digit)  34C  Wrist    2.7 <3.1 52.1 >12   Motor Summary Table   Site NR Onset (ms) Norm Onset (ms) O-P Amp (mV) Norm O-P Amp Site1 Site2 Delta-0 (ms) Dist (cm) Vel (m/s) Norm Vel (m/s)  Left Median Motor (Abd Poll Brev)  34C  Wrist    3.0 <3.9 4.8 >6 Elbow Wrist 4.3 27.0 63 >50  Elbow    7.3  4.7  Axilla Elbow 3.8 0.0    Axilla    3.5  4.7         Left Peroneal Motor (Ext Dig Brev)  34C  Ankle    3.8 <5.5 6.8 >3 B Fib Ankle 6.9 37.0 54 >40  B Fib    10.7  6.3  Poplt B Fib 1.3 10.0 77 >40  Poplt    12.0  6.1         Left Tibial Motor (Abd Hall Brev)  34C  Ankle    3.8 <6.0 10.0 >8 Knee Ankle 10.0 42.0 42 >40  Knee    13.8  0.0         Site 3    11.8  6.0         Left Ulnar Motor (Abd Dig Minimi)  34C  Wrist    2.7 <3.1 10.2 >7 B Elbow Wrist 3.3 23.0 70 >50  B Elbow    6.0  9.8  A Elbow B Elbow  1.5 10.0 67 >50  A Elbow    7.5  9.4          Comparison Summary Table   Site NR Peak (ms) Norm Peak (ms) P-T Amp (V) Site1 Site2 Delta-P (ms) Norm Delta (ms)  Left Median/Ulnar Palm Comparison (Wrist - 8cm)  34C  Median Palm    1.5 <2.2 81.4 Median Palm Ulnar Palm 0.1   Ulnar Palm    1.6 <2.2 19.4       H Reflex Studies   NR H-Lat (ms) Lat Norm (ms) L-R H-Lat (ms)  Left Tibial (Gastroc)  34C     33.33 <35    EMG   Side Muscle Ins Act Fibs Psw Fasc Number Recrt Dur Dur. Amp Amp. Poly Poly. Comment  Left AntTibialis Nml Nml Nml Nml Nml Nml Nml Nml Nml Nml Nml Nml N/A  Left Gastroc Nml Nml Nml Nml 2- Mod-V Nml Nml Nml Nml Nml Nml N/A  Left Flex Dig Long Nml Nml Nml Nml 2- Mod-V Nml Nml Nml Nml Nml Nml N/A  Left RectFemoris Nml Nml Nml Nml Nml Nml Nml Nml Nml Nml Nml Nml N/A  Left GluteusMed Nml Nml Nml Nml Nml Nml Nml Nml Nml Nml Nml Nml N/A  Left 1stDorInt Nml Nml Nml Nml Nml Nml Nml Nml Nml Nml Nml Nml N/A  Left Ext Indicis Nml Nml Nml Nml Nml Nml Nml Nml Nml Nml Nml Nml N/A  Left  PronatorTeres Nml Nml Nml Nml Nml Nml Nml Nml Nml Nml Nml Nml N/A  Left Biceps Nml Nml Nml Nml Nml Nml Nml Nml Nml Nml Nml Nml N/A  Left Triceps Nml Nml Nml Nml Nml Nml Nml Nml Nml Nml Nml Nml N/A  Left Deltoid Nml Nml Nml Nml Nml Nml Nml Nml Nml Nml Nml Nml N/A     Waveforms:

## 2017-07-22 ENCOUNTER — Telehealth: Payer: Self-pay | Admitting: *Deleted

## 2017-07-22 DIAGNOSIS — E559 Vitamin D deficiency, unspecified: Secondary | ICD-10-CM | POA: Diagnosis not present

## 2017-07-22 DIAGNOSIS — B9562 Methicillin resistant Staphylococcus aureus infection as the cause of diseases classified elsewhere: Secondary | ICD-10-CM | POA: Diagnosis not present

## 2017-07-22 DIAGNOSIS — N951 Menopausal and female climacteric states: Secondary | ICD-10-CM | POA: Diagnosis not present

## 2017-07-22 NOTE — Telephone Encounter (Signed)
Patient given results and instructions.   

## 2017-07-22 NOTE — Telephone Encounter (Signed)
-----   Message from [place] Chardonika K Patel, DO sent at 07/22/2017  8:05 AM EDT ----- Please inform patient that her nerve testing is normal.  No evidence of nerve injury, carpal tunnel syndrome, or radiculopathy.  Because both her MRI brain and nerve testing is normal and has excluded worrisome causes, her pain is most likely stemming from fibromyalgia and she should follow-up with her PCP for management of this.

## 2017-08-10 DIAGNOSIS — E282 Polycystic ovarian syndrome: Secondary | ICD-10-CM | POA: Diagnosis not present

## 2017-08-10 DIAGNOSIS — B9562 Methicillin resistant Staphylococcus aureus infection as the cause of diseases classified elsewhere: Secondary | ICD-10-CM | POA: Diagnosis not present

## 2017-08-10 DIAGNOSIS — N951 Menopausal and female climacteric states: Secondary | ICD-10-CM | POA: Diagnosis not present

## 2017-08-10 DIAGNOSIS — M797 Fibromyalgia: Secondary | ICD-10-CM | POA: Diagnosis not present

## 2017-09-10 ENCOUNTER — Encounter: Payer: Self-pay | Admitting: Internal Medicine

## 2017-09-10 ENCOUNTER — Ambulatory Visit (INDEPENDENT_AMBULATORY_CARE_PROVIDER_SITE_OTHER): Payer: 59 | Admitting: Internal Medicine

## 2017-09-10 VITALS — BP 132/82 | HR 70 | Ht 65.0 in | Wt 236.0 lb

## 2017-09-10 DIAGNOSIS — Z8742 Personal history of other diseases of the female genital tract: Secondary | ICD-10-CM

## 2017-09-10 DIAGNOSIS — E8940 Asymptomatic postprocedural ovarian failure: Secondary | ICD-10-CM

## 2017-09-10 NOTE — Patient Instructions (Signed)
Please stop at the lab.  Please continue Estrogen compounded cream and consider decreasing Progesterone.  Please come back for a follow-up appointment in 6 months.

## 2017-09-10 NOTE — Progress Notes (Addendum)
Patient ID: Mindy Kelley, female   DOB: 06/04/1986, 31 y.o.   MRN: [id number]  HPI: Mindy Kelley is a 31 y.o. female, referred by Dr. Karilyn Cota, for management of surgical premature menopause and h/o PCOS.  Patient has a previous history of PCOS, endometriosis, L ovarian tumor >> she had to have TAH/BSO at 31 years old.   Right after surgery, she was started on: - estradiol patch >> skin peeling - estradiol ring >> yeast infections, irritation - estrogen gel >> tolerated well, but $$$ - po Estrace >> hives - vaginal Estrace >> local allergic reaction   - now Estradiol cream ([employer]) on inside of arms or thighs - initially 0.1 mg >> 0.2 mg now. She tolerates this very well.  She is also on: - Prometrium 100 mg daily at night  Pt describes: - menarche at 80 - irregular menses, excess hair (legs, toes, abd., arms, face, neck, chest) - waxes, plucks; had Laser tx 2009 - no acne - + h/o ovarian cysts She is G9P0A9. Last miscarriage 12/2013 >> after this, a lot of bleeding >> triggered hysterectomy.   Previous investigation (checked while on 0.1 mg Estradiol, Progesterone): - LH 37.9, FSH: 47.4 - Progesterone 0.5, Estradiol 19  - Last thyroid tests: 05/05/2017: TSH 1.35, fT4 1.1, fT3 1.3 No results found for: TSH, FREET4  For PCOS, she was previously on metformin, but she had intolerance to this.   Meals: - Breakfast: Oatmeal or eggs, fruit - Lunch: Chicken, veggies - Dinner: [place] burger, fruit - Snacks: 1 a day  Exercise: Cardio 2-3 times weekly.  ROS: Constitutional: + weight gain, + fatigue, no subjective hyperthermia, no subjective hypothermia Eyes: no blurry vision, no xerophthalmia ENT: no sore throat, no nodules palpated in throat, no dysphagia, no odynophagia, no hoarseness Cardiovascular: no CP/no SOB/no palpitations/no leg swelling Respiratory: no cough/no SOB/no wheezing Gastrointestinal: + N/no V/no D/no C/no acid reflux Musculoskeletal: + muscle  aches/+ joint aches Skin: no rashes, + excessive hair growth (see history of present illness) Neurological: no tremors/no numbness/no tingling/no dizziness + Low libido  Past Medical History:  Diagnosis Date  . Bronchitis   . Cleft lip   . GERD (gastroesophageal reflux disease)   . Headache    migraines  . Insomnia   . Numbness and tingling of foot   . PCOS (polycystic ovarian syndrome)   . PONV (postoperative nausea and vomiting)    pt developed fever 105 returned back to hospital for admission  . Swelling of both ankles    Past Surgical History:  Procedure Laterality Date  . CLEFT LIP REPAIR    . COLPOSCOPY    . LAPAROSCOPIC ASSISTED VAGINAL HYSTERECTOMY Bilateral 02/06/2015   Procedure: LAPAROSCOPIC ASSISTED VAGINAL HYSTERECTOMY, BILATERAL SALPINGO-OOPHORECTOMY, BIOPSY PELVIC PERITOMEUM;  Surgeon: Hal Morales, MD;  Location: [facility];  Service: Gynecology;  Laterality: Bilateral;  . PILONIDAL CYST EXCISION     Social History   Social History  . Marital status: Single    Spouse name: N/A  . Number of children: 0  . Years of education: 12   Occupational History  . housekeeper [employer]   Social History Main Topics  . Smoking status: Never Smoker  . Smokeless tobacco: Never Used  . Alcohol use Yes     Comment: rare  . Drug use: No  . Sexual activity: Not on file   Other Topics Concern  . Not on file   Social History Narrative   Lives with best friend in a  one story home.  No children.     Works in Public affairs consultant at [employer].     Education: high school.    Current Outpatient Prescriptions on File Prior to Visit  Medication Sig Dispense Refill  . albuterol (PROVENTIL HFA;VENTOLIN HFA) 108 (90 BASE) MCG/ACT inhaler Inhale 2 puffs into the lungs every 6 (six) hours as needed for wheezing or shortness of breath.    . cyclobenzaprine (FLEXERIL) 5 MG tablet 1-2 tablets every 8 hours as needed for persistent muscle contraction pain. (Patient  taking differently: 5 mg. 1-2 tablets every 8 hours as needed for persistent muscle contraction pain.) 30 tablet 0  . dexamethasone (DECADRON) 4 MG tablet Take 1 tablet (4 mg total) by mouth 2 (two) times daily with a meal. 12 tablet 0  . DULoxetine (CYMBALTA) 60 MG capsule Take 60 mg by mouth daily.    . hydrOXYzine (ATARAX/VISTARIL) 25 MG tablet Take 25 mg by mouth 3 (three) times daily as needed.    . loratadine (CLARITIN) 10 MG tablet Take 10 mg by mouth daily.    Marland Kitchen omeprazole (PRILOSEC) 20 MG capsule Take 20 mg by mouth daily.    Marland Kitchen triamcinolone (KENALOG) 0.025 % cream Apply 1 application topically daily.    . vitamin C (ASCORBIC ACID) 500 MG tablet Take 500 mg by mouth daily.    . Vitamin D, Ergocalciferol, (DRISDOL) 50000 UNITS CAPS capsule Take 50,000 Units by mouth 2 (two) times a week.     No current facility-administered medications on file prior to visit.    Allergies  Allergen Reactions  . Estradiol     Both forms.   . Latex Hives  . Mango Flavor Hives, Itching and Swelling    Swelling is of the lips and tongue.  . Metformin And Related Nausea And Vomiting   Family History  Problem Relation Age of Onset  . Transient ischemic attack Mother   . Healthy Father   . Seizures Brother   Also: - Diabetes in brothers and aunt - Hypertension and hyperlipidemia in aunt and uncle - Thyroid disease in cousin and grandmother  PE: BP 132/82 (BP Location: Left Arm, Patient Position: Sitting)   Pulse 70   Ht  (1.651 m)   Wt 236 lb (107 kg)   LMP 12/02/2014 (Exact Date)   SpO2 97%   BMI 39.27 kg/m  Wt Readings from Last 3 Encounters:  09/10/17 236 lb (107 kg)  06/12/17 234 lb 3 oz (106.2 kg)  05/18/17 230 lb (104.3 kg)   Constitutional: Obese, in NAD, no full supraclavicular fat pads Eyes: PERRLA, EOMI, no exophthalmos ENT: moist mucous membranes, no thyromegaly, no cervical lymphadenopathy Cardiovascular: RRR, No MRG Respiratory: CTA B Gastrointestinal: abdomen  soft, NT, ND, BS+ Musculoskeletal: no deformities, strength intact in all 4 Skin: moist, warm; no acne on face, + dark terminal hair on chin Neurological: no tremor with outstretched hands, DTR normal in all 4  ASSESSMENT: 1. Surgical premature menopause  2. H/O PCOS  PLAN: 1. Surgical premature menopause - Patient has a history of numerous miscarriages, endometriosis, ovarian cysts, and also on ovarian mass. She developed increased bleeding after her last miscarriage 3 years ago and ended up having TAH + BSO at 31 years old. She was started on hormone replacement therapy after her surgery, but she had problems tolerating the estrogen formulations. She is currently on compounded estradiol cream, which she tolerates well. Her dose was increased from 100 g to 200 g daily in ~06/2017  and she feels better on this dose with less vaginal dryness and less hot flashes. She was also started on Prometrium as her progesterone level was low, however, we discussed that this is not absolutely mandatory since she does not have a uterus. She does have some nausea and we discussed about possibly moving the Prometrium every other night to see the nausea improves.  - We discussed the importance of continuing estrogen for the health of her bones, But also from the point of view of hot flashes and vaginal dryness. Since she is tolerating well the current formulation of estradiol, I strongly encouraged her to continue. - We also discussed about androgen replacement in women with premature menopause and reviewed the guidelines. Supplementation with testosterone or DHEA is controversial and not shown to impact patients significantly. In her case, with her history of PCOS and hirsutism, I would not recommend any of the above formulations. Patient agrees. - We discussed about the possible risk of cardiovascular events and breast cancer in patients on hormone replacement therapy, however, these have not been shown to be  increased in cases of premature menopause. However, continuing HRT beyond approximately 10 years after the average age of menopause, which is 31 years old in [place], is not indicated because of increased risk of the above conditions.  2. H/O PCOS - I explained that after removal of her ovaries, the source of increased estrogen and testosterone is removed, so her PCOS is not problematic anymore. However, the decrease in androgen levels may take several years so we decided to check the levels of her androgens (also some of these can originate in the adrenals) and we may need to start spironolactone to help with her excess hair. I did explain how spironolactone works (mainly by blocking testosterone receptors, but also decreasing testosterone synthesis), I explained the benefits and side effects (possible hypotension and hyperkalemia) and the fact that we will need to have her back for a nurse visit to check her blood pressure in the lab visit to check her potassium 1 week after starting the medication. She does agree to try the spironolactone in case her androgen levels are elevated.  - We also discussed about the Vaniqa cream, which is expensive, and not very efficient in helping with hirsutism  - She tried metformin but she could not tolerate it so would not recommend it for her   - I will see her back in 6 months  Component     Latest Ref Rng & Units 09/10/2017  Testosterone     8 - 48 ng/dL 9  Testosterone Free     0.0 - 4.2 pg/mL 0.3  Sex Horm Binding Glob, Serum     24.6 - 122.0 nmol/L 27.3  17-OH-Progesterone, LC/MS/MS     ng/dL <8  Androstenedione     ng/dL 27  DHEA-SO4     23 - 161 mcg/dL 096   Labs are all normal. No sign of persistent PCOS.  Carlus Pavlov, MD PhD [facility]

## 2017-09-11 LAB — TESTOSTERONE, FREE, TOTAL, SHBG
SEX HORMONE BINDING: 27.3 nmol/L (ref 24.6–122.0)
TESTOSTERONE FREE: 0.3 pg/mL (ref 0.0–4.2)
Testosterone: 9 ng/dL (ref 8–48)

## 2017-09-14 ENCOUNTER — Encounter: Payer: Self-pay | Admitting: Internal Medicine

## 2017-09-14 LAB — DHEA-SULFATE: DHEA-SO4: 161 ug/dL (ref 23–266)

## 2017-09-14 LAB — 17-HYDROXYPROGESTERONE: 17-OH-Progesterone, LC/MS/MS: <8 ng/dL

## 2017-09-14 LAB — ANDROSTENEDIONE: Androstenedione: 27 ng/dL

## 2017-09-23 DIAGNOSIS — R5383 Other fatigue: Secondary | ICD-10-CM | POA: Diagnosis not present

## 2017-09-23 DIAGNOSIS — E282 Polycystic ovarian syndrome: Secondary | ICD-10-CM | POA: Diagnosis not present

## 2017-09-23 DIAGNOSIS — Z713 Dietary counseling and surveillance: Secondary | ICD-10-CM | POA: Diagnosis not present

## 2017-09-23 DIAGNOSIS — E559 Vitamin D deficiency, unspecified: Secondary | ICD-10-CM | POA: Diagnosis not present

## 2017-09-23 DIAGNOSIS — N951 Menopausal and female climacteric states: Secondary | ICD-10-CM | POA: Diagnosis not present

## 2017-09-23 DIAGNOSIS — R6882 Decreased libido: Secondary | ICD-10-CM | POA: Diagnosis not present

## 2017-09-23 NOTE — Telephone Encounter (Signed)
Dr Karilyn Cota office is asking of last office notes for this patient.  Attention to [employer] # [phone]

## 2017-10-21 DIAGNOSIS — E282 Polycystic ovarian syndrome: Secondary | ICD-10-CM | POA: Diagnosis not present

## 2017-10-21 DIAGNOSIS — N951 Menopausal and female climacteric states: Secondary | ICD-10-CM | POA: Diagnosis not present

## 2017-10-21 DIAGNOSIS — M797 Fibromyalgia: Secondary | ICD-10-CM | POA: Diagnosis not present

## 2017-11-25 DIAGNOSIS — E559 Vitamin D deficiency, unspecified: Secondary | ICD-10-CM | POA: Diagnosis not present

## 2017-11-25 DIAGNOSIS — N951 Menopausal and female climacteric states: Secondary | ICD-10-CM | POA: Diagnosis not present

## 2017-11-25 DIAGNOSIS — M797 Fibromyalgia: Secondary | ICD-10-CM | POA: Diagnosis not present

## 2017-11-25 DIAGNOSIS — R5383 Other fatigue: Secondary | ICD-10-CM | POA: Diagnosis not present

## 2017-11-25 MED FILL — GABAPENTIN 300 MG CAPSULE: 300 | 30 days supply | Qty: 90 | Fill #0

## 2017-12-03 DIAGNOSIS — M25569 Pain in unspecified knee: Secondary | ICD-10-CM | POA: Diagnosis not present

## 2017-12-03 DIAGNOSIS — M79641 Pain in right hand: Secondary | ICD-10-CM | POA: Diagnosis not present

## 2017-12-03 DIAGNOSIS — M2550 Pain in unspecified joint: Secondary | ICD-10-CM | POA: Diagnosis not present

## 2017-12-03 DIAGNOSIS — M79642 Pain in left hand: Secondary | ICD-10-CM | POA: Diagnosis not present

## 2017-12-03 DIAGNOSIS — M25562 Pain in left knee: Secondary | ICD-10-CM | POA: Diagnosis not present

## 2017-12-03 DIAGNOSIS — M797 Fibromyalgia: Secondary | ICD-10-CM | POA: Diagnosis not present

## 2017-12-03 DIAGNOSIS — M25561 Pain in right knee: Secondary | ICD-10-CM | POA: Diagnosis not present

## 2017-12-03 DIAGNOSIS — E669 Obesity, unspecified: Secondary | ICD-10-CM | POA: Diagnosis not present

## 2017-12-03 DIAGNOSIS — M359 Systemic involvement of connective tissue, unspecified: Secondary | ICD-10-CM | POA: Diagnosis not present

## 2017-12-10 ENCOUNTER — Other Ambulatory Visit: Payer: Self-pay

## 2017-12-10 ENCOUNTER — Emergency Department (HOSPITAL_COMMUNITY)
Admission: EM | Admit: 2017-12-10 | Discharge: 2017-12-11 | Disposition: A | Discharge status: Home / Self Care | Payer: PRIVATE HEALTH INSURANCE | Attending: Emergency Medicine | Admitting: Emergency Medicine

## 2017-12-10 ENCOUNTER — Encounter (HOSPITAL_COMMUNITY): Payer: Self-pay

## 2017-12-10 ENCOUNTER — Emergency Department (HOSPITAL_COMMUNITY): Payer: PRIVATE HEALTH INSURANCE

## 2017-12-10 DIAGNOSIS — W231XXA Caught, crushed, jammed, or pinched between stationary objects, initial encounter: Secondary | ICD-10-CM | POA: Diagnosis not present

## 2017-12-10 DIAGNOSIS — S62634A Displaced fracture of distal phalanx of right ring finger, initial encounter for closed fracture: Secondary | ICD-10-CM | POA: Diagnosis not present

## 2017-12-10 DIAGNOSIS — Z91040 Latex allergy status: Secondary | ICD-10-CM | POA: Diagnosis not present

## 2017-12-10 DIAGNOSIS — Y998 Other external cause status: Secondary | ICD-10-CM | POA: Insufficient documentation

## 2017-12-10 DIAGNOSIS — S6010XA Contusion of unspecified finger with damage to nail, initial encounter: Secondary | ICD-10-CM | POA: Diagnosis not present

## 2017-12-10 DIAGNOSIS — Z79899 Other long term (current) drug therapy: Secondary | ICD-10-CM | POA: Diagnosis not present

## 2017-12-10 DIAGNOSIS — Y9289 Other specified places as the place of occurrence of the external cause: Secondary | ICD-10-CM | POA: Insufficient documentation

## 2017-12-10 DIAGNOSIS — S62632A Displaced fracture of distal phalanx of right middle finger, initial encounter for closed fracture: Secondary | ICD-10-CM | POA: Insufficient documentation

## 2017-12-10 DIAGNOSIS — Y9389 Activity, other specified: Secondary | ICD-10-CM | POA: Insufficient documentation

## 2017-12-10 DIAGNOSIS — S6991XA Unspecified injury of right wrist, hand and finger(s), initial encounter: Secondary | ICD-10-CM | POA: Diagnosis present

## 2017-12-10 MED ORDER — ONDANSETRON HCL 4 MG PO TABS
4.0000 mg | ORAL_TABLET | Freq: Once | ORAL | Status: AC
  Administered 2017-12-10: 4 mg via ORAL
  Filled 2017-12-10: qty 1

## 2017-12-10 MED ORDER — BUPIVACAINE HCL (PF) 0.25 % IJ SOLN
10.0000 mL | Freq: Once | INTRAMUSCULAR | Status: AC
  Administered 2017-12-10: 10 mL
  Filled 2017-12-10: qty 30

## 2017-12-10 MED ORDER — HYDROCODONE-ACETAMINOPHEN 5-325 MG PO TABS
2.0000 | ORAL_TABLET | Freq: Once | ORAL | Status: AC
  Administered 2017-12-10: 2 via ORAL
  Filled 2017-12-10: qty 2

## 2017-12-10 MED ORDER — IBUPROFEN 800 MG PO TABS
800.0000 mg | ORAL_TABLET | Freq: Once | ORAL | Status: AC
  Administered 2017-12-10: 800 mg via ORAL
  Filled 2017-12-10: qty 1

## 2017-12-10 NOTE — ED Provider Notes (Signed)
[facility] EMERGENCY DEPARTMENT Provider Note   CSN: [id number] Arrival date & time: 12/10/17  2142     History   Chief Complaint Chief Complaint  Patient presents with  . Hand Pain    smashed finger right hand ring and middle    HPI Cinda QuestShameka Duch is a 31 y.o. female.  Patient is a 31 year old female who presents to the department with injury to the right hand.  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  She was cleaning a table, when the table collapsed and smashed her ring finger and her middle finger on the right hand.  The patient denies any injury to any other area.  The patient denies being on any anticoagulation medications.  She has not had any previous operations or procedures involving the right hand.      Past Medical History:  Diagnosis Date  . Bronchitis   . Cleft lip   . GERD (gastroesophageal reflux disease)   . Headache    migraines  . Insomnia   . Numbness and tingling of foot   . PCOS (polycystic ovarian syndrome)   . PONV (postoperative nausea and vomiting)    pt developed fever 105 returned back to hospital for admission  . Swelling of both ankles     Patient Active Problem List   Diagnosis Date Noted  . Premature surgical menopause 09/10/2017  . History of PCOS 02/06/2015  . Pelvic pain in female 02/06/2015    Past Surgical History:  Procedure Laterality Date  . CLEFT LIP REPAIR    . COLPOSCOPY    . LAPAROSCOPIC ASSISTED VAGINAL HYSTERECTOMY Bilateral 02/06/2015   Procedure: LAPAROSCOPIC ASSISTED VAGINAL HYSTERECTOMY, BILATERAL SALPINGO-OOPHORECTOMY, BIOPSY PELVIC PERITOMEUM;  Surgeon: Hal MoralesVanessa P Haygood, MD;  Location: [facility];  Service: Gynecology;  Laterality: Bilateral;  . PILONIDAL CYST EXCISION      OB History    No data available       Home Medications    Prior to Admission medications   Medication Sig Start Date End Date Taking? Authorizing Provider  albuterol (PROVENTIL HFA;VENTOLIN HFA) 108 (90 BASE)  MCG/ACT inhaler Inhale 2 puffs into the lungs every 6 (six) hours as needed for wheezing or shortness of breath.    [provider]  cyclobenzaprine (FLEXERIL) 5 MG tablet 1-2 tablets every 8 hours as needed for persistent muscle contraction pain. Patient taking differently: 5 mg. 1-2 tablets every 8 hours as needed for persistent muscle contraction pain. 02/07/15   Haygood, Maris BergerVanessa P, MD  dexamethasone (DECADRON) 4 MG tablet Take 1 tablet (4 mg total) by mouth 2 (two) times daily with a meal. 05/18/17   Ivery QualeBryant, Renee Erb, PA-C  DULoxetine (CYMBALTA) 60 MG capsule Take 60 mg by mouth daily.    [provider]  estradiol (ESTRACE VAGINAL) 0.1 MG/GM vaginal cream Place 1 Applicatorful vaginally at bedtime.    [provider]  hydrOXYzine (ATARAX/VISTARIL) 25 MG tablet Take 25 mg by mouth 3 (three) times daily as needed.    [provider]  loratadine (CLARITIN) 10 MG tablet Take 10 mg by mouth daily.    [provider]  omeprazole (PRILOSEC) 20 MG capsule Take 20 mg by mouth daily.    [provider]  Progesterone Micronized (PROGESTERONE PO) Take by mouth.    [provider]  triamcinolone (KENALOG) 0.025 % cream Apply 1 application topically daily.    [provider]  vitamin C (ASCORBIC ACID) 500 MG tablet Take 500 mg by mouth daily.  [provider]  Vitamin D, Ergocalciferol, (DRISDOL) 50000 UNITS CAPS capsule Take 50,000 Units by mouth 2 (two) times a week.    [provider]    Family History Family History  Problem Relation Age of Onset  . Transient ischemic attack Mother   . Healthy Father   . Seizures Brother     Social History Social History   Tobacco Use  . Smoking status: Never Smoker  . Smokeless tobacco: Never Used  Substance Use Topics  . Alcohol use: Yes    Comment: rare  . Drug use: No     Allergies   Estradiol; Latex; Mango flavor; and Metformin and related   Review of  Systems Review of Systems  Constitutional: Negative for activity change.       All ROS Neg except as noted in HPI  HENT: Negative for nosebleeds.   Eyes: Negative for photophobia and discharge.  Respiratory: Negative for cough, shortness of breath and wheezing.   Cardiovascular: Negative for chest pain and palpitations.  Gastrointestinal: Negative for abdominal pain and blood in stool.  Genitourinary: Negative for dysuria, frequency and hematuria.  Musculoskeletal: Negative for arthralgias, back pain and neck pain.  Skin: Negative.   Neurological: Negative for dizziness, seizures and speech difficulty.  Psychiatric/Behavioral: Negative for confusion and hallucinations.     Physical Exam Updated Vital Signs BP (!) 153/75 (BP Location: Left Arm)   Pulse 82   Temp 98.2 F (36.8 C) (Oral)   Resp 16   Ht 5\' 5"  (1.651 m)   Wt 101.6 kg (224 lb)   LMP 12/02/2014 (Exact Date)   SpO2 98%   BMI 37.28 kg/m   Physical Exam  Constitutional: She is oriented to person, place, and time. She appears well-developed and well-nourished.  Non-toxic appearance.  HENT:  Head: Normocephalic.  Right Ear: Tympanic membrane and external ear normal.  Left Ear: Tympanic membrane and external ear normal.  Eyes: EOM and lids are normal. Pupils are equal, round, and reactive to light.  Neck: Normal range of motion. Neck supple. Carotid bruit is not present.  Cardiovascular: Normal rate, regular rhythm, normal heart sounds, intact distal pulses and normal pulses.  Pulmonary/Chest: Breath sounds normal. No respiratory distress.  Abdominal: Soft. Bowel sounds are normal. There is no tenderness. There is no guarding.  Musculoskeletal: Normal range of motion. She exhibits tenderness.       Right hand: She exhibits tenderness and swelling. She exhibits normal capillary refill. Normal sensation noted. Normal strength noted.       Hands: There is full range of motion of the right shoulder elbow and wrist.  There  is no bruising or deformity of the thenar eminence.  There is swelling from the distal tuft to the PIP of the middle finger and the ring finger.  There is bruising of the distal plantar surface.  There is question of a developing subungual hematoma of the right middle finger.  Lymphadenopathy:       Head (right side): No submandibular adenopathy present.       Head (left side): No submandibular adenopathy present.    She has no cervical adenopathy.  Neurological: She is alert and oriented to person, place, and time. She has normal strength. No cranial nerve deficit or sensory deficit.  Skin: Skin is warm and dry.  Psychiatric: She has a normal mood and affect. Her speech is normal.  Nursing note and vitals reviewed.    ED Treatments / Results  Labs (all labs ordered  are listed, but only abnormal results are displayed) Labs Reviewed - No data to display  EKG  EKG Interpretation None       Radiology No results found.  Procedures FRACTURE CARE RIGHT MIDDLE AND RING FINGERS. Marland Kitchen.Splint Application Date/Time: 12/11/2017 1:48 AM Performed by: Ivery QualeBryant, Willford Rabideau, PA-C Authorized by: Ivery QualeBryant, Nathali Vent, PA-C   Consent:    Consent obtained:  Verbal   Consent given by:  Patient   Risks discussed:  Pain and swelling Pre-procedure details:    Sensation:  Normal   Skin color:  Bruising Procedure details:    Laterality:  Right   Location:  Finger   Finger:  R index finger and R long finger   Splint type:  Finger (finger splint to the index and the middle finger.)   Supplies:  Aluminum splint and cotton padding (ACE bandage.) Post-procedure details:    Pain:  Improved   Sensation:  Normal   Skin color:  Normal   Patient tolerance of procedure:  Tolerated well, no immediate complications .Nail Removal Date/Time: 12/11/2017 1:50 AM Performed by: Ivery QualeBryant, Dody Smartt, PA-C Authorized by: Ivery QualeBryant, Xxavier Noon, PA-C   Consent:    Consent obtained:  Verbal   Consent given by:  Patient   Risks  discussed:  Infection, pain and permanent nail deformity Location:    Hand:  R long finger Pre-procedure details:    Skin preparation:  Alcohol   Preparation: Patient was prepped and draped in the usual sterile fashion   Anesthesia (see MAR for exact dosages):    Anesthesia method:  Nerve block   Block location:  Right middle finger   Block needle gauge:  30 G   Block anesthetic:  Bupivacaine 0.25% w/o epi   Block technique:  Digital   Block injection procedure:  Anatomic landmarks identified, introduced needle, incremental injection and negative aspiration for blood   Block outcome:  Anesthesia achieved Trephination:    Subungual hematoma drained: yes     Trephination instrument:  Needle Post-procedure details:    Dressing: bandage.   Patient tolerance of procedure:  Tolerated well, no immediate complications   (including critical care time)  Medications Ordered in ED Medications - No data to display   Initial Impression / Assessment and Plan / ED Course  I have reviewed the triage vital signs and the nursing notes.  Pertinent labs & imaging results that were available during my care of the patient were reviewed by me and considered in my medical decision making (see chart for details).       Final Clinical Impressions(s) / ED Diagnoses MDM Patient sustained injury to the index finger and the middle finger of the right hand after a table collapsed on her hand at work.  I discussed the procedure of draining a subungual hematoma as well as splinting the distal tuft fractures of these 2 fingers with the patient in terms which she understands.  She gives permission to proceed.  Patient identified by armband.  Procedural timeout taken.  The index finger and the middle finger of the right hand were cleansed with alcohol.  Digital block was achieved with 0.25% bupivacaine.  After adequate anesthetic, the subungual hematoma was drained, and the splints were applied to the  fracture area.  I have instructed the patient to keep her hand elevated above her heart as much as possible.  We discussed the importance of keeping the splint clean and dry.  Patient will follow up with Dr. Romeo AppleHarrison for orthopedic management.  A prescription for ibuprofen  600 mg with breakfast, lunch, dinner, and at bedtime as well as Norco every 4 hours been given to the patient.  The patient is to follow-up with Dr. Romeo Apple.  Patient is to return to the emergency department if any emergent changes, problems, or concerns.  The patient is in agreement with this plan.   Final diagnoses:  Closed displaced fracture of distal phalanx of right middle finger, initial encounter  Closed displaced fracture of distal phalanx of right ring finger, initial encounter  Subungual hematoma of digit of hand, initial encounter    ED Discharge Orders    None       Ivery Quale, PA-C 12/11/17 [id number]    Loren Racer, MD 12/14/17 1005

## 2017-12-10 NOTE — ED Triage Notes (Signed)
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

## 2017-12-10 NOTE — ED Notes (Signed)
Pt returned from xray

## 2017-12-11 ENCOUNTER — Telehealth: Payer: Self-pay | Admitting: Orthopedic Surgery

## 2017-12-11 MED ORDER — IBUPROFEN 600 MG PO TABS: 600.0000 mg | ORAL_TABLET | Freq: Four times a day (QID) | ORAL | 0 refills | Status: AC

## 2017-12-11 MED ORDER — HYDROCODONE-ACETAMINOPHEN 7.5-325 MG PO TABS: 1.0000 | ORAL_TABLET | ORAL | 0 refills | Status: AC | PRN

## 2017-12-11 MED ORDER — HYDROCODONE-ACETAMINOPHEN 5-325 MG PO TABS: 1.0000 | ORAL_TABLET | ORAL | 0 refills | Status: AC | PRN

## 2017-12-11 NOTE — Telephone Encounter (Signed)
Pt called this morning and stated she went ER and has fx fingers.  This is WC through [employer] [facility].  We have no WC info this morning.    Please advise on if or when to schedule this patient here  Thanks

## 2017-12-11 NOTE — Telephone Encounter (Signed)
Next open appt

## 2017-12-11 NOTE — Discharge Instructions (Signed)
You have fractures of the right middle finger and ring finger.  You also had blood under the nail of the middle finger which is known as a subungual hematoma.  This is been released.  Please keep your splint in place until you are seen by the orthopedic specialist.  Please keep the splint clean and dry.  Keep your hand elevated above your heart as much as possible.  Use ibuprofen with breakfast, lunch, dinner, and at bedtime.  May use Norco for more severe pain.  Norco may cause drowsiness.  Please do not drive, operate machinery, drink alcohol, or participate in activities requiring concentration when taking this medication.  Please call Dr. Romeo AppleHarrison for an office appointment as soon as possible.

## 2017-12-11 NOTE — Telephone Encounter (Signed)
Patient contacted, discussed Workers comp protocol.  Patient voiced understanding of process of continuing to follow up with supervisor at [facility] regarding workers comp approval. Aware that the insurer (through [employer]) will need to contact our office directly regarding approval if our provider(s) may treat patient for this injury, and will need to provider claim number, contact information and billing information.  Appointment is pending.

## 2017-12-17 MED FILL — Hydrocodone-Acetaminophen Tab 5-325 MG: ORAL | Qty: 6 | Status: AC

## 2018-02-24 DIAGNOSIS — M797 Fibromyalgia: Secondary | ICD-10-CM | POA: Diagnosis not present

## 2018-02-24 DIAGNOSIS — R5383 Other fatigue: Secondary | ICD-10-CM | POA: Diagnosis not present

## 2018-02-24 DIAGNOSIS — E2839 Other primary ovarian failure: Secondary | ICD-10-CM | POA: Diagnosis not present

## 2018-02-24 MED FILL — PROGESTERONE 200 MG CAPSULE: 200 | 30 days supply | Qty: 30 | Fill #0

## 2018-02-24 MED FILL — ARMOUR THYROID 30 MG TABLET: 30 | 30 days supply | Qty: 30 | Fill #0

## 2018-02-25 DIAGNOSIS — M545 Low back pain: Secondary | ICD-10-CM | POA: Diagnosis not present

## 2018-02-25 DIAGNOSIS — M797 Fibromyalgia: Secondary | ICD-10-CM | POA: Diagnosis not present

## 2018-02-25 DIAGNOSIS — M359 Systemic involvement of connective tissue, unspecified: Secondary | ICD-10-CM | POA: Diagnosis not present

## 2018-02-25 DIAGNOSIS — E669 Obesity, unspecified: Secondary | ICD-10-CM | POA: Diagnosis not present

## 2018-02-25 DIAGNOSIS — M25569 Pain in unspecified knee: Secondary | ICD-10-CM | POA: Diagnosis not present

## 2018-02-25 DIAGNOSIS — M549 Dorsalgia, unspecified: Secondary | ICD-10-CM | POA: Diagnosis not present

## 2018-02-25 DIAGNOSIS — M2550 Pain in unspecified joint: Secondary | ICD-10-CM | POA: Diagnosis not present

## 2018-03-04 DIAGNOSIS — M797 Fibromyalgia: Secondary | ICD-10-CM | POA: Diagnosis not present

## 2018-03-04 DIAGNOSIS — M2550 Pain in unspecified joint: Secondary | ICD-10-CM | POA: Diagnosis not present

## 2018-03-10 ENCOUNTER — Ambulatory Visit: Payer: Self-pay | Admitting: Internal Medicine

## 2018-03-24 DIAGNOSIS — E282 Polycystic ovarian syndrome: Secondary | ICD-10-CM | POA: Diagnosis not present

## 2018-03-24 DIAGNOSIS — M797 Fibromyalgia: Secondary | ICD-10-CM | POA: Diagnosis not present

## 2018-03-24 DIAGNOSIS — R5383 Other fatigue: Secondary | ICD-10-CM | POA: Diagnosis not present

## 2018-03-24 MED FILL — SPIRONOLACTONE 100 MG TAB: 100 | 30 days supply | Qty: 30 | Fill #0

## 2018-03-24 MED FILL — PROGESTERONE 200 MG CAPSULE: 200 | 30 days supply | Qty: 60 | Fill #0

## 2018-03-29 DIAGNOSIS — J019 Acute sinusitis, unspecified: Secondary | ICD-10-CM | POA: Diagnosis not present

## 2018-05-05 DIAGNOSIS — E282 Polycystic ovarian syndrome: Secondary | ICD-10-CM | POA: Diagnosis not present

## 2018-05-05 DIAGNOSIS — R5383 Other fatigue: Secondary | ICD-10-CM | POA: Diagnosis not present

## 2018-05-05 DIAGNOSIS — E2839 Other primary ovarian failure: Secondary | ICD-10-CM | POA: Diagnosis not present

## 2018-05-05 DIAGNOSIS — M797 Fibromyalgia: Secondary | ICD-10-CM | POA: Diagnosis not present

## 2018-05-05 DIAGNOSIS — N951 Menopausal and female climacteric states: Secondary | ICD-10-CM | POA: Diagnosis not present

## 2018-05-05 DIAGNOSIS — R6882 Decreased libido: Secondary | ICD-10-CM | POA: Diagnosis not present

## 2018-05-05 DIAGNOSIS — Z713 Dietary counseling and surveillance: Secondary | ICD-10-CM | POA: Diagnosis not present

## 2018-05-05 DIAGNOSIS — E559 Vitamin D deficiency, unspecified: Secondary | ICD-10-CM | POA: Diagnosis not present

## 2019-06-25 IMAGING — DX DG HAND COMPLETE 3+V*R*
3 series · 3 of 3 positions shown · non-contrast
Comparison: None.

CLINICAL DATA: Table collapsed on middle and ring fingers. Pain,
swelling and bruising of the third and fifth digits.

EXAM:
RIGHT HAND - COMPLETE 3+ VIEW

[hand pa]
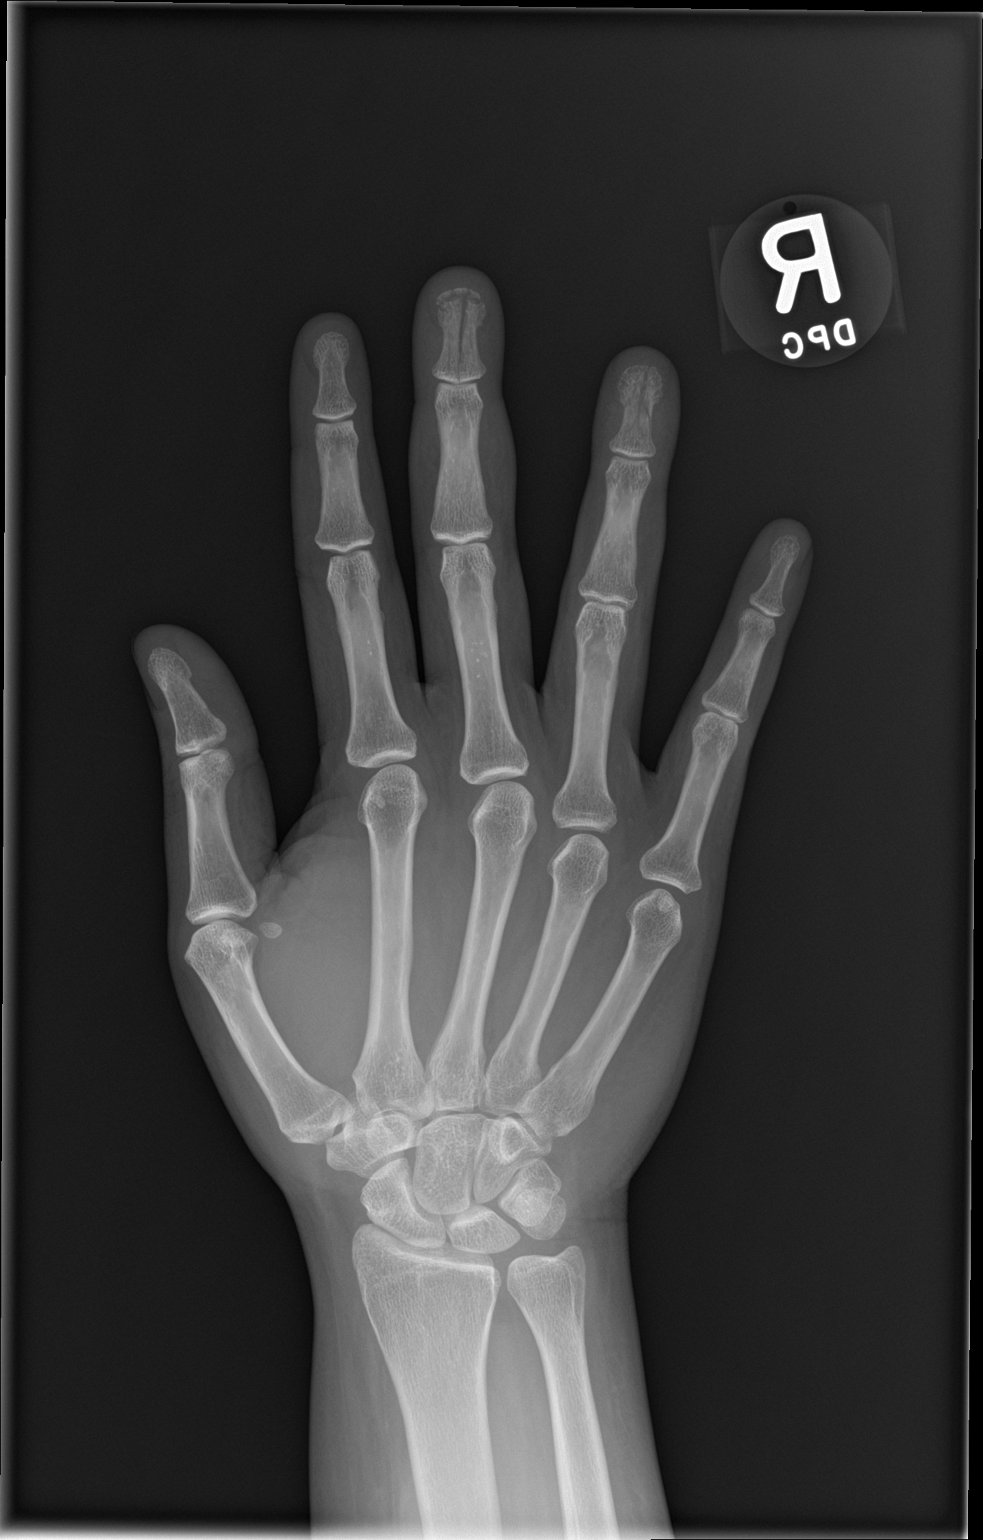

[hand obl]
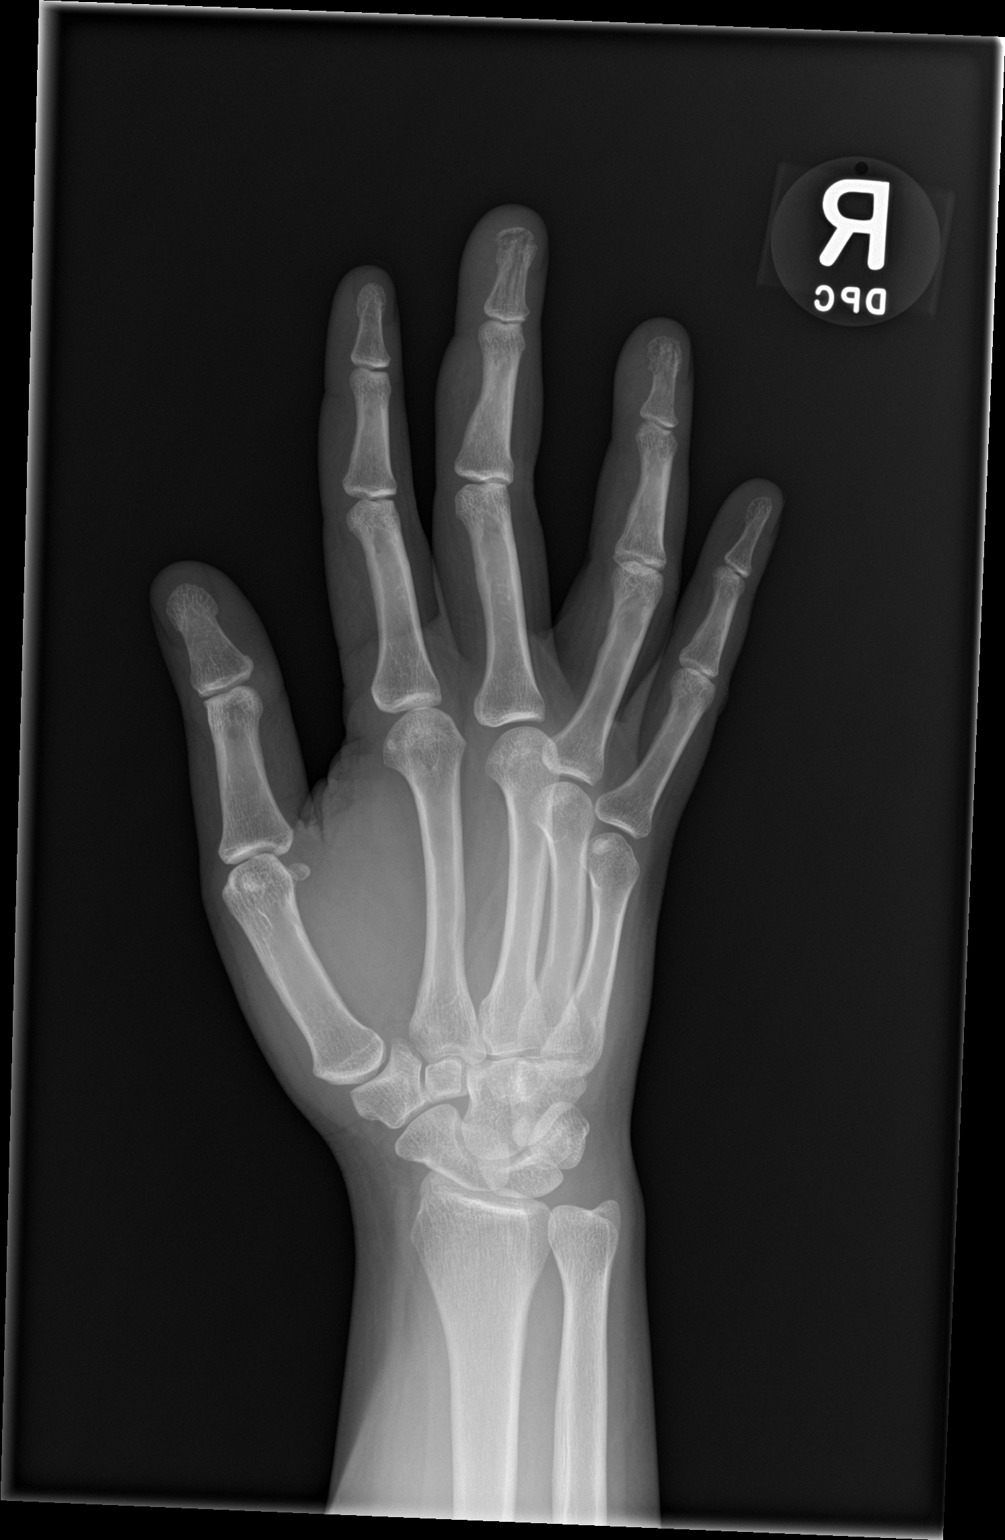

[hand lat]
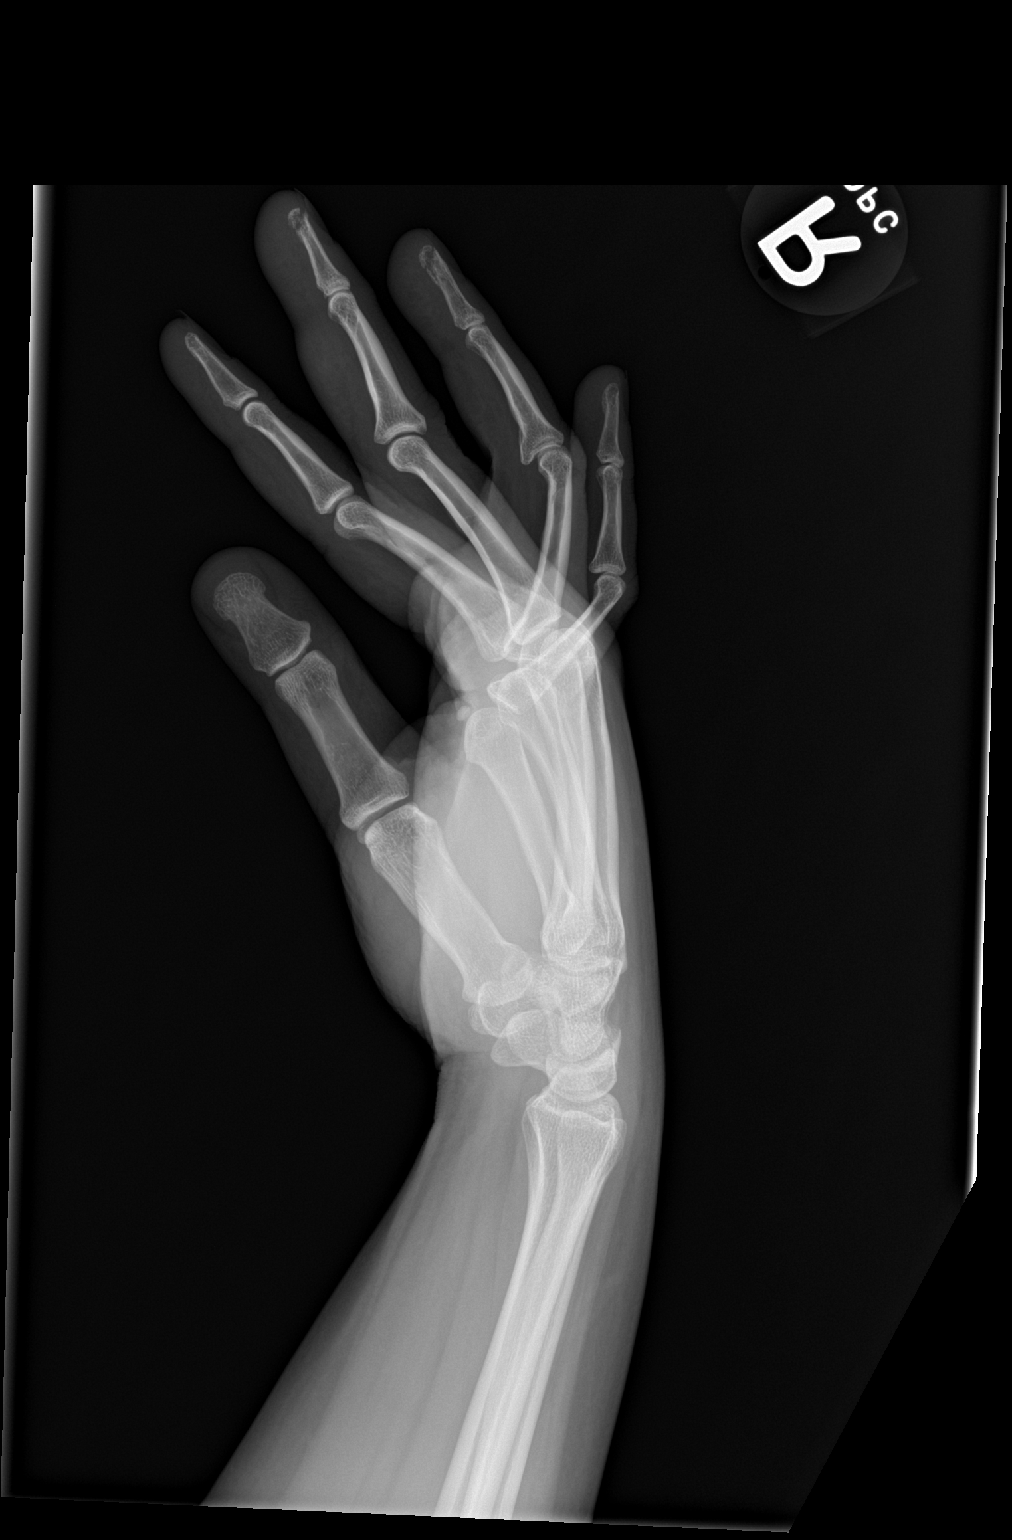

[3 of 3 positions shown; findings below may reference images not displayed]

FINDINGS: Comminuted fracture of middle finger distal phalanx involving the
distal tuft with the linear minimally displaced component extending
proximally to the articular surface.

Comminuted fracture of the ring finger distal phalanx involves the
distal tuft with nondisplaced components tracking proximally, no
intra-articular extension.

No fracture of the fifth digit the remainder the hand. Remote
fracture of the fourth digit proximal phalanx.

Soft tissue edema at the fracture sites.
IMPRESSION: Comminuted fractures of the middle and ring finger distal phalanges.
Middle finger fracture extends to the articular surface.a

## 2019-07-01 ENCOUNTER — Observation Stay (HOSPITAL_COMMUNITY)
Admission: EM | Admit: 2019-07-01 | Discharge: 2019-07-03 | Disposition: A | Discharge status: Other Acute Inpatient Hospital | Payer: Self-pay | Attending: Family Medicine | Admitting: Family Medicine

## 2019-07-01 DIAGNOSIS — T40602A Poisoning by unspecified narcotics, intentional self-harm, initial encounter: Secondary | ICD-10-CM

## 2019-07-01 DIAGNOSIS — T50902A Poisoning by unspecified drugs, medicaments and biological substances, intentional self-harm, initial encounter: Secondary | ICD-10-CM | POA: Diagnosis present

## 2019-07-01 DIAGNOSIS — T402X2A Poisoning by other opioids, intentional self-harm, initial encounter: Secondary | ICD-10-CM | POA: Insufficient documentation

## 2019-07-01 DIAGNOSIS — F329 Major depressive disorder, single episode, unspecified: Secondary | ICD-10-CM | POA: Insufficient documentation

## 2019-07-01 DIAGNOSIS — X58XXXA Exposure to other specified factors, initial encounter: Secondary | ICD-10-CM | POA: Insufficient documentation

## 2019-07-01 DIAGNOSIS — M329 Systemic lupus erythematosus, unspecified: Secondary | ICD-10-CM | POA: Insufficient documentation

## 2019-07-01 DIAGNOSIS — Z1159 Encounter for screening for other viral diseases: Secondary | ICD-10-CM | POA: Insufficient documentation

## 2019-07-01 DIAGNOSIS — K922 Gastrointestinal hemorrhage, unspecified: Secondary | ICD-10-CM | POA: Insufficient documentation

## 2019-07-01 DIAGNOSIS — T1491XA Suicide attempt, initial encounter: Principal | ICD-10-CM | POA: Insufficient documentation

## 2019-07-01 DIAGNOSIS — K920 Hematemesis: Secondary | ICD-10-CM | POA: Insufficient documentation

## 2019-07-01 DIAGNOSIS — T402X1A Poisoning by other opioids, accidental (unintentional), initial encounter: Secondary | ICD-10-CM | POA: Diagnosis present

## 2019-07-01 DIAGNOSIS — D72829 Elevated white blood cell count, unspecified: Secondary | ICD-10-CM | POA: Insufficient documentation

## 2019-07-01 DIAGNOSIS — K219 Gastro-esophageal reflux disease without esophagitis: Secondary | ICD-10-CM | POA: Insufficient documentation

## 2019-07-01 DIAGNOSIS — I959 Hypotension, unspecified: Secondary | ICD-10-CM | POA: Insufficient documentation

## 2019-07-01 DIAGNOSIS — R9431 Abnormal electrocardiogram [ECG] [EKG]: Secondary | ICD-10-CM | POA: Insufficient documentation

## 2019-07-01 DIAGNOSIS — N289 Disorder of kidney and ureter, unspecified: Secondary | ICD-10-CM | POA: Insufficient documentation

## 2019-07-01 DIAGNOSIS — F32A Depression, unspecified: Secondary | ICD-10-CM

## 2019-07-01 DIAGNOSIS — R4182 Altered mental status, unspecified: Secondary | ICD-10-CM | POA: Insufficient documentation

## 2019-07-01 HISTORY — DX: Suicide attempt, initial encounter: T14.91XA

## 2019-07-01 HISTORY — DX: Poisoning by other opioids, accidental (unintentional), initial encounter: T40.2X1A

## 2019-07-01 MED ORDER — FAMOTIDINE IN NACL 20-0.9 MG/50ML-% IV SOLN
20.0000 mg | Freq: Once | INTRAVENOUS | Status: AC
  Administered 2019-07-01: 20 mg via INTRAVENOUS
  Filled 2019-07-01: qty 50

## 2019-07-01 MED ORDER — ONDANSETRON HCL 4 MG/2ML IJ SOLN
4.0000 mg | Freq: Once | INTRAMUSCULAR | Status: AC
  Administered 2019-07-01: 4 mg via INTRAVENOUS
  Filled 2019-07-01: qty 2

## 2019-07-01 NOTE — ED Triage Notes (Signed)
Pt admits to taking dilaudid 2 mg tabs x 9 tabs tablets.   Pt will not answer as to why she took them, but does nod her head for yes when asked if she was trying to kill herself.  Pt received a total of 8 mg of Narcan per [facility] over a period of 45 minutes while on scene. Pt remains very drowsy.

## 2019-07-01 NOTE — ED Provider Notes (Addendum)
[facility] EMERGENCY DEPARTMENT Provider Note   CSN: [id number] Arrival date & time: 07/01/19  2259  Time seen 11:15 PM  History   Chief Complaint Chief Complaint  Patient presents with   Drug Overdose   Level 5 caveat for altered mental status  HPI Mindy Kelley is a 33 y.o. female.     HPI patient presents via [facility].  They report they were called for possible overdose.  When they arrived on the scene patient's pulse ox was 34% and her heart rate was 45.  They gave her bag valve mask ventilation and a total of Narcan 6 mg and she responded some.  She told them she overdosed by taking 9 Dilaudid 2 mg tablets in a suicide attempt.  She told them she was prescribed the Dilaudid for treatment of lupus however they only found 3 pill bottles.  One was for Xanax 0.5 mg prescribed in May 2015, Augmentin prescribed April 2019 and Septra DS prescribed June 2018.  When I review the [place] database she has not had any controlled substances prescribed in the past 2 years.  When I searched care everywhere [facility] or [facility].  On arrival patient is not verbal.  Shortly after I entered the room she started having a large amount of clear reddish brown-tinged fluid with small flakes in it.  PCP unknown  Past Medical History:  Diagnosis Date   Opioid overdose [facility](HCC)     Patient Active Problem List   Diagnosis Date Noted   Hematemesis 07/02/2019   Opioid overdose (HCC) 07/02/2019   Leukocytosis 07/02/2019   Hypocalcemia 07/02/2019       OB History   No obstetric history on file.      Home Medications    Prior to Admission medications   Not on File    Family History No family history on file.  Social History Social History   Tobacco Use   Smoking status: Not on file  Substance Use Topics   Alcohol use: Not on file   Drug use: Not on file  unknown   Allergies   Patient has no allergy information on  record.   Review of Systems Review of Systems  Unable to perform ROS: Mental status change     Physical Exam Updated Vital Signs BP 90/67    Pulse 89    Temp 97.9 F (36.6 C) (Oral)    Resp 11    SpO2 97%   Physical Exam Vitals signs and nursing note reviewed.  Constitutional:      Comments: Patient is awake vomiting however she does not make eye contact or verbally respond  HENT:     Head: Normocephalic and atraumatic.     Right Ear: External ear normal.     Left Ear: External ear normal.     Nose: Nose normal.  Eyes:     Extraocular Movements: Extraocular movements intact.     Conjunctiva/sclera: Conjunctivae normal.     Pupils: Pupils are equal, round, and reactive to light.     Comments: Pupils are small bilaterally  Neck:     Musculoskeletal: Normal range of motion and neck supple.  Cardiovascular:     Rate and Rhythm: Regular rhythm. Tachycardia present.     Pulses: Normal pulses.     Heart sounds: Normal heart sounds.  Pulmonary:     Effort: Pulmonary effort is normal. No respiratory distress.     Breath sounds: Normal breath  sounds.  Abdominal:     General: Abdomen is flat. Bowel sounds are normal.     Palpations: Abdomen is soft.  Musculoskeletal: Normal range of motion.        General: No deformity.  Skin:    General: Skin is warm and dry.     Capillary Refill: Capillary refill takes less than 2 seconds.     Findings: No erythema.  Neurological:     Comments: Patient is unable to cooperate  Psychiatric:     Comments: Unable to assess at this time      ED Treatments / Results  Labs (all labs ordered are listed, but only abnormal results are displayed) Results for orders placed or performed during the hospital encounter of 07/01/19  Comprehensive metabolic panel  Result Value Ref Range   Sodium 141 135 - 145 mmol/L   Potassium 3.9 3.5 - 5.1 mmol/L   Chloride 107 98 - 111 mmol/L   CO2 21 (L) 22 - 32 mmol/L   Glucose, Bld 106 (H) 70 - 99 mg/dL    BUN 14 6 - 20 mg/dL   Creatinine, Ser 1.18 (H) 0.44 - 1.00 mg/dL   Calcium 8.4 (L) 8.9 - 10.3 mg/dL   Total Protein 8.0 6.5 - 8.1 g/dL   Albumin 4.1 3.5 - 5.0 g/dL   AST 64 (H) 15 - 41 U/L   ALT 38 0 - 44 U/L   Alkaline Phosphatase 51 38 - 126 U/L   Total Bilirubin 0.6 0.3 - 1.2 mg/dL   GFR calc non Af Amer >60 >60 mL/min   GFR calc Af Amer >60 >60 mL/min   Anion gap 13 5 - 15  CBC with Differential  Result Value Ref Range   WBC 23.0 (H) 4.0 - 10.5 K/uL   RBC 4.82 3.87 - 5.11 MIL/uL   Hemoglobin 12.3 12.0 - 15.0 g/dL   HCT 40.9 36.0 - 46.0 %   MCV 84.9 80.0 - 100.0 fL   MCH 25.5 (L) 26.0 - 34.0 pg   MCHC 30.1 30.0 - 36.0 g/dL   RDW 13.5 11.5 - 15.5 %   Platelets 297 150 - 400 K/uL   nRBC 0.0 0.0 - 0.2 %   Neutrophils Relative % 92 %   Neutro Abs 21.0 (H) 1.7 - 7.7 K/uL   Lymphocytes Relative 3 %   Lymphs Abs 0.8 0.7 - 4.0 K/uL   Monocytes Relative 4 %   Monocytes Absolute 0.9 0.1 - 1.0 K/uL   Eosinophils Relative 0 %   Eosinophils Absolute 0.1 0.0 - 0.5 K/uL   Basophils Relative 0 %   Basophils Absolute 0.0 0.0 - 0.1 K/uL   Immature Granulocytes 1 %   Abs Immature Granulocytes 0.23 (H) 0.00 - 0.07 K/uL  Acetaminophen level  Result Value Ref Range   Acetaminophen (Tylenol), Serum 12 10 - 30 ug/mL  Salicylate level  Result Value Ref Range   Salicylate Lvl <5.3 2.8 - 30.0 mg/dL  Ethanol  Result Value Ref Range   Alcohol, Ethyl (B) <10 <10 mg/dL  Urine rapid drug screen (hosp performed)  Result Value Ref Range   Opiates POSITIVE (A) NONE DETECTED   Cocaine NONE DETECTED NONE DETECTED   Benzodiazepines NONE DETECTED NONE DETECTED   Amphetamines NONE DETECTED NONE DETECTED   Tetrahydrocannabinol POSITIVE (A) NONE DETECTED   Barbiturates NONE DETECTED NONE DETECTED  Urinalysis, Routine w reflex microscopic  Result Value Ref Range   Color, Urine YELLOW YELLOW   APPearance HAZY (A) CLEAR  Specific Gravity, Urine 1.024 1.005 - 1.030   pH 5.0 5.0 - 8.0   Glucose, UA  NEGATIVE NEGATIVE mg/dL   Hgb urine dipstick SMALL (A) NEGATIVE   Bilirubin Urine NEGATIVE NEGATIVE   Ketones, ur 5 (A) NEGATIVE mg/dL   Protein, ur 161100 (A) NEGATIVE mg/dL   Nitrite NEGATIVE NEGATIVE   Leukocytes,Ua NEGATIVE NEGATIVE   RBC / HPF 0-5 0 - 5 RBC/hpf   WBC, UA 11-20 0 - 5 WBC/hpf   Bacteria, UA RARE (A) NONE SEEN   Squamous Epithelial / LPF 0-5 0 - 5   Mucus PRESENT    Hyaline Casts, UA PRESENT   Pregnancy, urine  Result Value Ref Range   Preg Test, Ur NEGATIVE NEGATIVE  Occult bld gastric/duodenum (cup to lab)  Result Value Ref Range   pH, Gastric 3    Occult Blood, Gastric POSITIVE (A) NEGATIVE  Sample to Blood Bank  Result Value Ref Range   Blood Bank Specimen SAMPLE AVAILABLE FOR TESTING    Sample Expiration      07/02/2019,2359 Performed at [facility], [address] [place], [place] [address]      Laboratory interpretation all normal except mild renal insufficiency, positive UDS, leukocytosis, positive Gastroccult   EKG EKG Interpretation  Date/Time:  Friday July 01 2019 23:10:18 EDT Ventricular Rate:  96 PR Interval:    QRS Duration: 99 QT Interval:  388 QTC Calculation: 491 R Axis:   101 Text Interpretation:  Sinus rhythm Borderline right axis deviation Borderline T abnormalities, anterior leads Borderline prolonged QT interval No old tracing to compare Confirmed by Devoria AlbeKnapp, Lamona Eimer [phone]) on 07/01/2019 11:34:46 PM   Radiology No results found.  Procedures .Critical Care Performed by: Devoria AlbeKnapp, Navi Ewton, MD Authorized by: Devoria AlbeKnapp, Bernell Sigal, MD   Critical care provider statement:    Critical care time (minutes):  39   Critical care was necessary to treat or prevent imminent or life-threatening deterioration of the following conditions:  Toxidrome   Critical care was time spent personally by me on the following activities:  Discussions with consultants, evaluation of patient's response to treatment, examination of patient, obtaining history from patient or  surrogate, ordering and review of laboratory studies, pulse oximetry, re-evaluation of patient's condition and review of old charts   (including critical care time)  Medications Ordered in ED Medications  pantoprazole (PROTONIX) 80 mg in sodium chloride 0.9 % 100 mL IVPB (has no administration in time range)  pantoprazole (PROTONIX) 80 mg in sodium chloride 0.9 % 250 mL (0.32 mg/mL) infusion (has no administration in time range)  pantoprazole (PROTONIX) injection 40 mg (has no administration in time range)  ondansetron (ZOFRAN) injection 4 mg (4 mg Intravenous Given 07/01/19 2336)  famotidine (PEPCID) IVPB 20 mg premix (0 mg Intravenous Stopped 07/02/19 0018)  sodium chloride 0.9 % bolus 1,000 mL (0 mLs Intravenous Stopped 07/02/19 0147)  sodium chloride 0.9 % bolus 1,000 mL (0 mLs Intravenous Stopped 07/02/19 0148)  naloxone (NARCAN) injection 1 mg ( Intravenous Not Given 07/02/19 0114)     Initial Impression / Assessment and Plan / ED Course  I have reviewed the triage vital signs and the nursing notes.  Pertinent labs & imaging results that were available during my care of the patient were reviewed by me and considered in my medical decision making (see chart for details).        Recheck at 12:10 AM patient will open her eyes to verbal command however she still does not respond verbally to me.  Her nurse reports however she is told her she was still nauseated and she felt cold.  We have found out that her name was entered incorrectly and she does have a prior chart in our system.  Her last visit was in 2018 related to her fracture she had treated by Dr. Mina MarbleWeingold.  Her only other visit was related to having a hysterectomy.  Review of the [place] database under the correct name shows she was prescribed #30 tramadol January 2019, #30 hydrocodone 5/325 on December 14, 2017 and #20 hydrocodone 7.5/325 on December 11, 2017.   01:00AM patient is more awake, her speech is still hard to  understand.  She complains of being thirsty.  Blood pressure is 84/64 heart rate 85.  She is getting 2 L of normal saline bolus for her hypotension.  She was given a dose of Narcan 1 mg IV.  Patient verifies she took Dilaudid.  She states it was an old prescription.  Recheck at 2 AM patient is much easier to awaken now she is able to hold a conversation much easier.  She states she has been having some abdominal pain this week.  She states she has never had a stomach ulcer or family history of ulcers.  She denies taking any over-the-counter aspirin, ibuprofen, Motrin.  She states that when she took the Dilaudid that she did want to permanently harm herself.  Patient needs a mental health evaluation at admission however with this upper GI bleeding she needs evaluation by GI first.  I am going to talk to the hospitalist about admitting her medically and once she is evaluated by GI she can have her psychiatric evaluation.  IVC papers were filled out by me.  2:44 AM Dr. Robb Matarrtiz, hospitalist will admit  3:40 AM I talked to the patient.  She states she will voluntarily stay in the hospital.  Her IVC papers were not sent to the magistrate for now.  Final Clinical Impressions(s) / ED Diagnoses   Final diagnoses:  Narcotic overdose, intentional self-harm, initial encounter (HCC)  Hypotension, unspecified hypotension type  UGI bleed  Depression, unspecified depression type  Suicide attempt [facility](HCC)    Plan admission  Devoria AlbeIva Marcio Hoque, MD, Concha PyoFACEP    Yanis Juma, MD 07/02/19 0300    Devoria AlbeKnapp, Lita Flynn, MD 07/02/19 [phone]

## 2019-07-02 ENCOUNTER — Encounter (HOSPITAL_COMMUNITY): Payer: Self-pay | Admitting: Internal Medicine

## 2019-07-02 ENCOUNTER — Other Ambulatory Visit: Payer: Self-pay

## 2019-07-02 DIAGNOSIS — T402X1A Poisoning by other opioids, accidental (unintentional), initial encounter: Secondary | ICD-10-CM | POA: Diagnosis present

## 2019-07-02 DIAGNOSIS — D72829 Elevated white blood cell count, unspecified: Secondary | ICD-10-CM | POA: Diagnosis present

## 2019-07-02 DIAGNOSIS — K920 Hematemesis: Secondary | ICD-10-CM

## 2019-07-02 DIAGNOSIS — T402X2A Poisoning by other opioids, intentional self-harm, initial encounter: Secondary | ICD-10-CM

## 2019-07-02 DIAGNOSIS — D72825 Bandemia: Secondary | ICD-10-CM

## 2019-07-02 LAB — COMPREHENSIVE METABOLIC PANEL
ALT: 38 U/L (ref 0–44)
ALT: 38 U/L (ref 0–44)
AST: 62 U/L — ABNORMAL HIGH (ref 15–41)
AST: 64 U/L — ABNORMAL HIGH (ref 15–41)
Albumin: 3.8 g/dL (ref 3.5–5.0)
Albumin: 4.1 g/dL (ref 3.5–5.0)
Alkaline Phosphatase: 46 U/L (ref 38–126)
Alkaline Phosphatase: 51 U/L (ref 38–126)
Anion gap: 13 (ref 5–15)
Anion gap: 9 (ref 5–15)
BUN: 12 mg/dL (ref 6–20)
BUN: 14 mg/dL (ref 6–20)
CO2: 21 mmol/L — ABNORMAL LOW (ref 22–32)
CO2: 24 mmol/L (ref 22–32)
Calcium: 8.3 mg/dL — ABNORMAL LOW (ref 8.9–10.3)
Calcium: 8.4 mg/dL — ABNORMAL LOW (ref 8.9–10.3)
Chloride: 107 mmol/L (ref 98–111)
Chloride: 108 mmol/L (ref 98–111)
Creatinine, Ser: 0.84 mg/dL (ref 0.44–1.00)
Creatinine, Ser: 1.18 mg/dL — ABNORMAL HIGH (ref 0.44–1.00)
GFR calc Af Amer: >60 mL/min (ref >60)
GFR calc Af Amer: >60 mL/min (ref >60)
GFR calc non Af Amer: >60 mL/min (ref >60)
GFR calc non Af Amer: >60 mL/min (ref >60)
Glucose, Bld: 106 mg/dL — ABNORMAL HIGH (ref 70–99)
Glucose, Bld: 95 mg/dL (ref 70–99)
Potassium: 3.9 mmol/L (ref 3.5–5.1)
Potassium: 5.2 mmol/L — ABNORMAL HIGH (ref 3.5–5.1)
Sodium: 141 mmol/L (ref 135–145)
Sodium: 141 mmol/L (ref 135–145)
Total Bilirubin: 0.6 mg/dL (ref 0.3–1.2)
Total Bilirubin: 0.6 mg/dL (ref 0.3–1.2)
Total Protein: 7.5 g/dL (ref 6.5–8.1)
Total Protein: 8 g/dL (ref 6.5–8.1)

## 2019-07-02 LAB — URINALYSIS, ROUTINE W REFLEX MICROSCOPIC
Bilirubin Urine: NEGATIVE
Glucose, UA: NEGATIVE mg/dL
Ketones, ur: 5 mg/dL — AB
Leukocytes,Ua: NEGATIVE
Nitrite: NEGATIVE
Protein, ur: 100 mg/dL — AB
Specific Gravity, Urine: 1.024 (ref 1.005–1.030)
pH: 5 (ref 5.0–8.0)

## 2019-07-02 LAB — CBC WITH DIFFERENTIAL/PLATELET
Abs Immature Granulocytes: 0.1 10*3/uL — ABNORMAL HIGH (ref 0.00–0.07)
Abs Immature Granulocytes: 0.23 10*3/uL — ABNORMAL HIGH (ref 0.00–0.07)
Basophils Absolute: 0 10*3/uL (ref 0.0–0.1)
Basophils Absolute: 0 10*3/uL (ref 0.0–0.1)
Basophils Relative: 0 %
Basophils Relative: 0 %
Eosinophils Absolute: 0 10*3/uL (ref 0.0–0.5)
Eosinophils Absolute: 0.1 10*3/uL (ref 0.0–0.5)
Eosinophils Relative: 0 %
Eosinophils Relative: 0 %
HCT: 40.1 % (ref 36.0–46.0)
HCT: 40.9 % (ref 36.0–46.0)
Hemoglobin: 12.2 g/dL (ref 12.0–15.0)
Hemoglobin: 12.3 g/dL (ref 12.0–15.0)
Immature Granulocytes: 1 %
Immature Granulocytes: 1 %
Lymphocytes Relative: 3 %
Lymphocytes Relative: 4 %
Lymphs Abs: 0.7 10*3/uL (ref 0.7–4.0)
Lymphs Abs: 0.8 10*3/uL (ref 0.7–4.0)
MCH: 25.5 pg — ABNORMAL LOW (ref 26.0–34.0)
MCH: 25.9 pg — ABNORMAL LOW (ref 26.0–34.0)
MCHC: 30.1 g/dL (ref 30.0–36.0)
MCHC: 30.4 g/dL (ref 30.0–36.0)
MCV: 84.9 fL (ref 80.0–100.0)
MCV: 85.1 fL (ref 80.0–100.0)
Monocytes Absolute: 0.6 10*3/uL (ref 0.1–1.0)
Monocytes Absolute: 0.9 10*3/uL (ref 0.1–1.0)
Monocytes Relative: 3 %
Monocytes Relative: 4 %
Neutro Abs: 16.3 10*3/uL — ABNORMAL HIGH (ref 1.7–7.7)
Neutro Abs: 21 10*3/uL — ABNORMAL HIGH (ref 1.7–7.7)
Neutrophils Relative %: 92 %
Neutrophils Relative %: 92 %
Platelets: 280 10*3/uL (ref 150–400)
Platelets: 297 10*3/uL (ref 150–400)
RBC: 4.71 MIL/uL (ref 3.87–5.11)
RBC: 4.82 MIL/uL (ref 3.87–5.11)
RDW: 13.5 % (ref 11.5–15.5)
RDW: 13.7 % (ref 11.5–15.5)
WBC: 17.7 10*3/uL — ABNORMAL HIGH (ref 4.0–10.5)
WBC: 23 10*3/uL — ABNORMAL HIGH (ref 4.0–10.5)
nRBC: 0 % (ref 0.0–0.2)
nRBC: 0 % (ref 0.0–0.2)

## 2019-07-02 LAB — GLUCOSE, CAPILLARY: Glucose-Capillary: 109 mg/dL — ABNORMAL HIGH (ref 70–99)

## 2019-07-02 LAB — RAPID URINE DRUG SCREEN, HOSP PERFORMED
Amphetamines: NOT DETECTED
Barbiturates: NOT DETECTED
Benzodiazepines: NOT DETECTED
Cocaine: NOT DETECTED
Opiates: POSITIVE — AB
Tetrahydrocannabinol: POSITIVE — AB

## 2019-07-02 LAB — OCCULT BLOOD GASTRIC / DUODENUM (SPECIMEN CUP)
Occult Blood, Gastric: POSITIVE — AB
pH, Gastric: 3

## 2019-07-02 LAB — MRSA PCR SCREENING: MRSA by PCR: NEGATIVE

## 2019-07-02 LAB — ETHANOL: Alcohol, Ethyl (B): <10 mg/dL (ref <10)

## 2019-07-02 LAB — SAMPLE TO BLOOD BANK

## 2019-07-02 LAB — PHOSPHORUS: Phosphorus: 3.9 mg/dL (ref 2.5–4.6)

## 2019-07-02 LAB — ACETAMINOPHEN LEVEL
Acetaminophen (Tylenol), Serum: 12 ug/mL (ref 10–30)
Acetaminophen (Tylenol), Serum: <10 ug/mL — ABNORMAL LOW (ref 10–30)

## 2019-07-02 LAB — SALICYLATE LEVEL: Salicylate Lvl: <7 mg/dL (ref 2.8–30.0)

## 2019-07-02 LAB — MAGNESIUM: Magnesium: 2 mg/dL (ref 1.7–2.4)

## 2019-07-02 LAB — HEMOGLOBIN
Hemoglobin: 11.5 g/dL — ABNORMAL LOW (ref 12.0–15.0)
Hemoglobin: 10 g/dL — ABNORMAL LOW (ref 12.0–15.0)

## 2019-07-02 LAB — SARS CORONAVIRUS 2 BY RT PCR (HOSPITAL ORDER, PERFORMED IN ~~LOC~~ HOSPITAL LAB): SARS Coronavirus 2: NEGATIVE

## 2019-07-02 LAB — PREGNANCY, URINE: Preg Test, Ur: NEGATIVE

## 2019-07-02 MED ORDER — PROCHLORPERAZINE EDISYLATE 10 MG/2ML IJ SOLN: 5.0000 mg | INTRAMUSCULAR | Status: DC | PRN

## 2019-07-02 MED ORDER — NALOXONE HCL 2 MG/2ML IJ SOSY
PREFILLED_SYRINGE | INTRAMUSCULAR | Status: AC
  Filled 2019-07-02: qty 2

## 2019-07-02 MED ORDER — PANTOPRAZOLE SODIUM 40 MG IV SOLR
INTRAVENOUS | Status: AC
  Filled 2019-07-02: qty 160

## 2019-07-02 MED ORDER — NALOXONE HCL 2 MG/2ML IJ SOSY
1.0000 mg | PREFILLED_SYRINGE | Freq: Once | INTRAMUSCULAR | Status: AC
  Administered 2019-07-02: 1 mg via INTRAVENOUS

## 2019-07-02 MED ORDER — SODIUM CHLORIDE 0.9 % IV BOLUS
1000.0000 mL | Freq: Once | INTRAVENOUS | Status: AC
  Administered 2019-07-02: 1000 mL via INTRAVENOUS

## 2019-07-02 MED ORDER — SODIUM CHLORIDE 0.9 % IV SOLN
INTRAVENOUS | Status: DC
  Administered 2019-07-02 – 2019-07-03 (×3): via INTRAVENOUS

## 2019-07-02 MED ORDER — SODIUM CHLORIDE 0.9 % IV SOLN
80.0000 mg | Freq: Once | INTRAVENOUS | Status: AC
  Administered 2019-07-02: 80 mg via INTRAVENOUS
  Filled 2019-07-02: qty 40

## 2019-07-02 MED ORDER — MAGNESIUM SULFATE 2 GM/50ML IV SOLN
2.0000 g | Freq: Once | INTRAVENOUS | Status: AC
  Administered 2019-07-02: 2 g via INTRAVENOUS
  Filled 2019-07-02: qty 50

## 2019-07-02 MED ORDER — PANTOPRAZOLE SODIUM 40 MG IV SOLR
INTRAVENOUS | Status: AC
  Filled 2019-07-02: qty 80

## 2019-07-02 MED ORDER — ONDANSETRON HCL 4 MG/2ML IJ SOLN
4.0000 mg | Freq: Four times a day (QID) | INTRAMUSCULAR | Status: DC | PRN
  Administered 2019-07-02: 4 mg via INTRAVENOUS
  Filled 2019-07-02: qty 2

## 2019-07-02 MED ORDER — POTASSIUM CHLORIDE IN NACL 20-0.9 MEQ/L-% IV SOLN
INTRAVENOUS | Status: DC
  Administered 2019-07-02: 05:00:00 via INTRAVENOUS
  Filled 2019-07-02: qty 1000

## 2019-07-02 MED ORDER — PANTOPRAZOLE SODIUM 40 MG IV SOLR: 40.0000 mg | Freq: Two times a day (BID) | INTRAVENOUS | Status: DC

## 2019-07-02 MED ORDER — ONDANSETRON HCL 4 MG PO TABS: 4.0000 mg | ORAL_TABLET | Freq: Four times a day (QID) | ORAL | Status: DC | PRN

## 2019-07-02 MED ORDER — SODIUM CHLORIDE 0.9 % IV BOLUS (SEPSIS)
1000.0000 mL | Freq: Once | INTRAVENOUS | Status: AC
  Administered 2019-07-02: 1000 mL via INTRAVENOUS

## 2019-07-02 MED ORDER — SODIUM CHLORIDE 0.9 % IV SOLN
8.0000 mg/h | INTRAVENOUS | Status: DC
  Administered 2019-07-02 – 2019-07-03 (×4): 8 mg/h via INTRAVENOUS
  Filled 2019-07-02 (×7): qty 80
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
## 2019-07-02 NOTE — Progress Notes (Signed)
Patient seen and evaluated, chart reviewed, please see EMR for updated orders. Please see full H&P dictated by admitting physician Dr. Olevia Bowens for same date of service.     33 year old admitted by Dr. Olevia Bowens on 07/02/2019 with concerns about overdose and suicidal attempt--- behavioral health counselor recommends inpatient psychiatric treatment.  ----Patient is currently medically cleared --- okay to transfer to inpatient psychiatric facility when bed is available --Apparently patient was not IVCD because she voluntarily agreed to go to inpatient psychiatric facility  --Potassium was 5.2 IV fluids changed --UDS with opiates and THC --Leukocytosis is probably reactive -Initial Tylenol level was 12 ,repeat Tylenol level less than 10 --No Further hematemesis, Gastroccult was positive- okay to feed -Hemoglobin stable at 11.5 after hydration, admission Hgb was 12.3  Patient seen and evaluated, chart reviewed, please see EMR for updated orders. Please see full H&P dictated by admitting physician Dr. Olevia Bowens for same date of service.   Roxan Hockey, MD

## 2019-07-02 NOTE — H&P (Signed)
Admitting hospitalist follow-up note.  Her follow up acetaminophen level was lower than the initial one.  I believe that, either the patient thought that she was taking hydrocodone and it may have been something else or although she says she took hydromorphone as well, she may have been confused as to what she was taking.  Tennis Must, MD.

## 2019-07-02 NOTE — H&P (Signed)
History and Physical    Mindy Kelley DUK:[id number] DOB: [birth year] DOA: 07/01/2019  PCP: Doree Albee, MD   Patient coming from: Home.  I have personally briefly reviewed patient's old medical records in [place]  Chief Complaint: Multiple meds OD.  HPI: Mindy Kelley is a 33 y.o. female with medical history significant of GERD, opiate overdose, cleft lip, headache who was brought to the emergency department due to altered mental status and hypoxia in the 30s when EMS arrived on scene.  They gave her 6 to 8 mg of Narcan and brought her to the emergency department.  She received another milligram of naloxone in the emergency department.  She initially has said that she took hydromorphone, but also admits taking several tablets of hydrocodone and several tablets of tramadol.  However, the controlled substance database does not show this being prescribed to her in the past 2 years.  She also used her Flexeril yesterday afternoon. She stated that she was intending to harm herself.  She had an episode of emesis in the emergency department which showed bloody streaks and was positive for Hemoccult. She otherwise denies any other symptoms like fever, chills, sore throat, rhinorrhea, dyspnea, chest pain, palpitations, dizziness, diaphoresis, PND or orthopnea, but states she occasionally gets lower extremity edema.  She denies dysuria, frequency or hematuria.  No polyuria, polydipsia, polyphagia or blurred vision.  ED Course: Initial temperature is 97.9 F, pulse 100, respiration 14, blood pressure 120/73 mmHg and O2 sat 95% on room air.  The patient subsequently became hypoxic and hypotensive, but this resolved with naloxone and IV fluid boluses.  Her UDS was positive for opiates and THC.  Urinalysis shows small hemoglobinuria, ketonuria 5 and proteinuria of 100 mg/dL.  Microscopic semination shows rare bacteria.  Her CBC showed a white count of 23.0 with 92% neutrophils, hemoglobin 12.3  g/dL and platelets 297.  CMP shows a CO2 of 21 mmol/L and a calcium of 8.4 mg/dL.  All other electrolytes are within normal limits.  Glucose 106, BUN 14, creatinine 3.76, salicylate less than 7 and alcohol less than 10 mg/dL.  Acetaminophen level was 12 mcg/mL.  LFTs were normal, except for an AST of 64.  Review of Systems: As per HPI otherwise 10 point review of systems negative.   Past Medical History:  Diagnosis Date  . Opioid overdose [facility])     Past Surgical History:  Procedure Laterality Date  . ABDOMINAL HYSTERECTOMY    . PILONIDAL CYST EXCISION       reports that she has never smoked. She has never used smokeless tobacco. She reports previous alcohol use. She reports previous drug use.  Not on File  Family History  Problem Relation Age of Onset  . Fibromyalgia Mother   . Seizures Brother   . Diabetes Mellitus I Brother    Prior to Admission medications   Not on File   Physical Exam: Vitals:   07/02/19 0230 07/02/19 0300 07/02/19 0330 07/02/19 0400  BP: 90/67 102/78 116/81 106/69  Pulse: 89 89 89 91  Resp: 11 14 14 14   Temp:      TempSrc:      SpO2: 97% 98% 97% 99%    Constitutional: NAD, calm, comfortable Eyes: PERRL, lids and conjunctivae normal ENMT: Mucous membranes are moist. Posterior pharynx clear of any exudate or lesions. Neck: normal, supple, no masses, no thyromegaly Respiratory: clear to auscultation bilaterally, no wheezing, no crackles. Normal respiratory effort. No accessory muscle use.  Cardiovascular: Regular rate  and rhythm, no murmurs / rubs / gallops. No extremity edema. 2+ pedal pulses. No carotid bruits.  Abdomen: Soft, no tenderness, no masses palpated. No hepatosplenomegaly. Bowel sounds positive.  Musculoskeletal: no clubbing / cyanosis. Good ROM, no contractures. Normal muscle tone.  Skin: no rashes, lesions, ulcers on limited dermatological examination. Neurologic: CN 2-12 grossly intact. Sensation intact, DTR normal. Strength 5/5 in  all 4.  Psychiatric: Normal judgment and insight. Alert and oriented x 3. Normal mood.   Labs on Admission: I have personally reviewed following labs and imaging studies  CBC: Recent Labs  Lab 07/01/19 2345  WBC 23.0*  NEUTROABS 21.0*  HGB 12.3  HCT 40.9  MCV 84.9  PLT 297   Basic Metabolic Panel: Recent Labs  Lab 07/01/19 2345  NA 141  K 3.9  CL 107  CO2 21*  GLUCOSE 106*  BUN 14  CREATININE 1.18*  CALCIUM 8.4*   GFR: CrCl cannot be calculated (Unknown ideal weight.). Liver Function Tests: Recent Labs  Lab 07/01/19 2345  AST 64*  ALT 38  ALKPHOS 51  BILITOT 0.6  PROT 8.0  ALBUMIN 4.1   No results for input(s): LIPASE, AMYLASE in the last 168 hours. No results for input(s): AMMONIA in the last 168 hours. Coagulation Profile: No results for input(s): INR, PROTIME in the last 168 hours. Cardiac Enzymes: No results for input(s): CKTOTAL, CKMB, CKMBINDEX, TROPONINI in the last 168 hours. BNP (last 3 results) No results for input(s): PROBNP in the last 8760 hours. HbA1C: No results for input(s): HGBA1C in the last 72 hours. CBG: No results for input(s): GLUCAP in the last 168 hours. Lipid Profile: No results for input(s): CHOL, HDL, LDLCALC, TRIG, CHOLHDL, LDLDIRECT in the last 72 hours. Thyroid Function Tests: No results for input(s): TSH, T4TOTAL, FREET4, T3FREE, THYROIDAB in the last 72 hours. Anemia Panel: No results for input(s): VITAMINB12, FOLATE, FERRITIN, TIBC, IRON, RETICCTPCT in the last 72 hours. Urine analysis:    Component Value Date/Time   COLORURINE YELLOW 07/02/2019 0010   APPEARANCEUR HAZY (A) 07/02/2019 0010   LABSPEC 1.024 07/02/2019 0010   PHURINE 5.0 07/02/2019 0010   GLUCOSEU NEGATIVE 07/02/2019 0010   HGBUR SMALL (A) 07/02/2019 0010   BILIRUBINUR NEGATIVE 07/02/2019 0010   KETONESUR 5 (A) 07/02/2019 0010   PROTEINUR 100 (A) 07/02/2019 0010   NITRITE NEGATIVE 07/02/2019 0010   LEUKOCYTESUR NEGATIVE 07/02/2019 0010     Radiological Exams on Admission: No results found.  EKG: Independently reviewed.  Vent. rate 96 BPM PR interval * ms QRS duration 99 ms QT/QTc 388/491 ms P-R-T axes 82 101 5 Sinus rhythm Borderline right axis deviation Borderline T abnormalities, anterior leads Borderline prolonged QT interval No old tracing to compare Confirmed by Devoria AlbeKnapp, Iva ([id number]) on 7/10  Assessment/Plan Principal Problem:   Hematemesis Admit to stepdown/inpatient. Keep n.p.o. Continue IV fluids. Continue pantoprazole infusion. Monitor H&H. Consult gastroenterology.  Active Problems:   Opioid overdose (HCC) This was intentional. Resolved with naloxone. Continue close monitoring. Consult behavioral health.    Leukocytosis Likely a stress-induced. Follow-up WBC.    Hypocalcemia Repeat level later today. If still low, will check PTH and vitamin D level.   DVT prophylaxis: SCDs. Code Status: Full code. Family Communication: Disposition Plan: Observation for polypharmaceutical OD and hematemesis.. Consults called: Routine GI and behavioral health consults. Admission status: Observation/stepdown.   Bobette Moavid Manuel Bridey [place] MD [facility]  07/02/2019, 5:11 AM   This document was prepared using Dragon voice recognition software and may contain some unintended transcription  errors.

## 2019-07-02 NOTE — Progress Notes (Signed)
Patient meets criteria for inpatient treatment. No appropriate or available beds at [facility]. CSW faxed referrals to the following facilities for review:  [facility]  [facility]  [facility]   TTS will continue to seek bed placement.  Chalmers Guest. Guerry Bruin, MSW, [place] Work/Disposition Phone: [phone] Fax: [phone]

## 2019-07-02 NOTE — BH Assessment (Signed)
Priscille Loveless, NP recommends psychiatric hospitalization due to the severity of pt's overdose. This writer was unable to reach pt's mother by phone, no message was left in VM.

## 2019-07-02 NOTE — ED Notes (Signed)
Pt more alert at this time and talking on the phone.

## 2019-07-02 NOTE — ED Notes (Signed)
Patient nodded her head yes when ask if she intentionally meant to harm her self by taking 9 dilaudid.

## 2019-07-03 ENCOUNTER — Inpatient Hospital Stay (HOSPITAL_COMMUNITY)
Admission: AD | Admit: 2019-07-03 | Discharge: 2019-07-06 | DRG: 885 | Disposition: A | Discharge status: Home / Self Care | Payer: Federal, State, Local not specified - Other | Source: Intra-hospital | Attending: Psychiatry | Admitting: Psychiatry

## 2019-07-03 ENCOUNTER — Other Ambulatory Visit: Payer: Self-pay

## 2019-07-03 ENCOUNTER — Encounter (HOSPITAL_COMMUNITY): Payer: Self-pay | Admitting: *Deleted

## 2019-07-03 DIAGNOSIS — Z91040 Latex allergy status: Secondary | ICD-10-CM | POA: Diagnosis not present

## 2019-07-03 DIAGNOSIS — F322 Major depressive disorder, single episode, severe without psychotic features: Principal | ICD-10-CM | POA: Diagnosis present

## 2019-07-03 DIAGNOSIS — Z915 Personal history of self-harm: Secondary | ICD-10-CM

## 2019-07-03 DIAGNOSIS — Z79899 Other long term (current) drug therapy: Secondary | ICD-10-CM | POA: Diagnosis not present

## 2019-07-03 DIAGNOSIS — Z833 Family history of diabetes mellitus: Secondary | ICD-10-CM | POA: Diagnosis not present

## 2019-07-03 DIAGNOSIS — R45851 Suicidal ideations: Secondary | ICD-10-CM | POA: Diagnosis present

## 2019-07-03 DIAGNOSIS — M797 Fibromyalgia: Secondary | ICD-10-CM | POA: Diagnosis present

## 2019-07-03 DIAGNOSIS — T50901A Poisoning by unspecified drugs, medicaments and biological substances, accidental (unintentional), initial encounter: Secondary | ICD-10-CM

## 2019-07-03 DIAGNOSIS — F329 Major depressive disorder, single episode, unspecified: Secondary | ICD-10-CM | POA: Diagnosis present

## 2019-07-03 DIAGNOSIS — Z91018 Allergy to other foods: Secondary | ICD-10-CM | POA: Diagnosis not present

## 2019-07-03 DIAGNOSIS — K5900 Constipation, unspecified: Secondary | ICD-10-CM | POA: Diagnosis not present

## 2019-07-03 DIAGNOSIS — M329 Systemic lupus erythematosus, unspecified: Secondary | ICD-10-CM | POA: Diagnosis present

## 2019-07-03 DIAGNOSIS — T50902A Poisoning by unspecified drugs, medicaments and biological substances, intentional self-harm, initial encounter: Secondary | ICD-10-CM | POA: Diagnosis present

## 2019-07-03 DIAGNOSIS — T50904A Poisoning by unspecified drugs, medicaments and biological substances, undetermined, initial encounter: Secondary | ICD-10-CM | POA: Diagnosis not present

## 2019-07-03 DIAGNOSIS — F419 Anxiety disorder, unspecified: Secondary | ICD-10-CM | POA: Diagnosis present

## 2019-07-03 DIAGNOSIS — Z6836 Body mass index (BMI) 36.0-36.9, adult: Secondary | ICD-10-CM

## 2019-07-03 DIAGNOSIS — K219 Gastro-esophageal reflux disease without esophagitis: Secondary | ICD-10-CM | POA: Diagnosis present

## 2019-07-03 DIAGNOSIS — Z888 Allergy status to other drugs, medicaments and biological substances status: Secondary | ICD-10-CM | POA: Diagnosis not present

## 2019-07-03 LAB — CBC WITH DIFFERENTIAL/PLATELET
Abs Immature Granulocytes: 0.02 10*3/uL (ref 0.00–0.07)
Basophils Absolute: 0 10*3/uL (ref 0.0–0.1)
Basophils Relative: 0 %
Eosinophils Absolute: 0 10*3/uL (ref 0.0–0.5)
Eosinophils Relative: 0 %
HCT: 32.4 % — ABNORMAL LOW (ref 36.0–46.0)
Hemoglobin: 9.6 g/dL — ABNORMAL LOW (ref 12.0–15.0)
Immature Granulocytes: 0 %
Lymphocytes Relative: 40 %
Lymphs Abs: 3.8 10*3/uL (ref 0.7–4.0)
MCH: 25.5 pg — ABNORMAL LOW (ref 26.0–34.0)
MCHC: 29.6 g/dL — ABNORMAL LOW (ref 30.0–36.0)
MCV: 86.2 fL (ref 80.0–100.0)
Monocytes Absolute: 0.4 10*3/uL (ref 0.1–1.0)
Monocytes Relative: 5 %
Neutro Abs: 5.2 10*3/uL (ref 1.7–7.7)
Neutrophils Relative %: 55 %
Platelets: 206 10*3/uL (ref 150–400)
RBC: 3.76 MIL/uL — ABNORMAL LOW (ref 3.87–5.11)
RDW: 13.6 % (ref 11.5–15.5)
WBC: 9.5 10*3/uL (ref 4.0–10.5)
nRBC: 0 % (ref 0.0–0.2)

## 2019-07-03 MED ORDER — PANTOPRAZOLE SODIUM 40 MG PO TBEC: 40.0000 mg | DELAYED_RELEASE_TABLET | Freq: Two times a day (BID) | ORAL | 1 refills | Status: AC

## 2019-07-03 MED ORDER — ACETAMINOPHEN 325 MG PO TABS: 650.0000 mg | ORAL_TABLET | Freq: Four times a day (QID) | ORAL | Status: DC | PRN

## 2019-07-03 MED ORDER — HYDROXYZINE HCL 25 MG PO TABS: 25.0000 mg | ORAL_TABLET | Freq: Three times a day (TID) | ORAL | Status: DC | PRN

## 2019-07-03 MED ORDER — PANTOPRAZOLE SODIUM 40 MG IV SOLR
INTRAVENOUS | Status: AC
  Filled 2019-07-03: qty 80

## 2019-07-03 MED ORDER — PANTOPRAZOLE SODIUM 40 MG PO TBEC
40.0000 mg | DELAYED_RELEASE_TABLET | Freq: Every day | ORAL | Status: DC
  Filled 2019-07-03 (×5): qty 1

## 2019-07-03 MED ORDER — TRAZODONE HCL 50 MG PO TABS: 50.0000 mg | ORAL_TABLET | Freq: Every evening | ORAL | Status: DC | PRN

## 2019-07-03 MED ORDER — ALUM & MAG HYDROXIDE-SIMETH 200-200-20 MG/5ML PO SUSP: 30.0000 mL | ORAL | Status: DC | PRN

## 2019-07-03 MED ORDER — MAGNESIUM HYDROXIDE 400 MG/5ML PO SUSP: 30.0000 mL | Freq: Every day | ORAL | Status: DC | PRN

## 2019-07-03 MED ORDER — PANTOPRAZOLE SODIUM 40 MG PO TBEC: 40.0000 mg | DELAYED_RELEASE_TABLET | Freq: Two times a day (BID) | ORAL | Status: DC

## 2019-07-03 NOTE — Progress Notes (Signed)
Called report to [facility] RN on [phone]

## 2019-07-03 NOTE — BH Assessment (Signed)
[facility] Assessment Progress Note   Patient staffed with Marvia Pickles, NP who continues to recommend inpatient treatment.

## 2019-07-03 NOTE — Tx Team (Signed)
Initial Treatment Plan 07/03/2019 11:16 PM [name] IRS:[id number]    PATIENT STRESSORS: Financial difficulties Health problems   PATIENT STRENGTHS: Active sense of humor Average or above average intelligence Supportive family/friends   PATIENT IDENTIFIED PROBLEMS: Depression  Suicidal Ideation (Pt now denies)          "get well enough to go home"         DISCHARGE CRITERIA:  Improved stabilization in mood, thinking, and/or behavior Motivation to continue treatment in a less acute level of care Need for constant or close observation no longer present Verbal commitment to aftercare and medication compliance  PRELIMINARY DISCHARGE PLAN: Outpatient therapy Return to previous living arrangement  PATIENT/FAMILY INVOLVEMENT: This treatment plan has been presented to and reviewed with the patient, Mindy Kelley.  The patient and family have been given the opportunity to ask questions and make suggestions.  Margaretann Loveless, RN 07/03/2019, 11:16 PM

## 2019-07-03 NOTE — Progress Notes (Addendum)
Mindy Kelley from [facility] called and informed staff of bed being available for patient. Staff was informed  to transport patient by [facility] which was [facility].   MD informed and orders given for discharge. Patient signed consent and report was called to nurse at [facility]. Discharge package given to patient

## 2019-07-03 NOTE — BH Assessment (Signed)
[place] Assessment Progress Note   Patient was seen for re-assessment today.  She presented as being very pleasant and positive.  Patient states that she is not feeling depressed or suicidal today.  She admits that she struggles with chronic pain, but states that she does not want to die.  Patient states that at the time she took the pills that she was really despondent and tired of hurting all the time.  She states that she has good support at home and her family has contacted her therapist and she plans to resume counseling sessions.  When asked if she feels like she needs to be in the hospital, she states, "no."  However, she states that she will comply with whatever [facility] staff feels like needs to happen.  TTS to staff patient with Marvia Pickles, NP for final disposition.

## 2019-07-03 NOTE — Discharge Instructions (Signed)
Transfer to [facility] 

## 2019-07-03 NOTE — Progress Notes (Signed)
Patient Demographics:    Mindy Kelley, is a 33 y.o. female, DOB - [birth year], FUX:[id number]  Admit date - 07/01/2019   Admitting Physician Reubin Milan, MD  Outpatient Primary MD for the patient is Patient, No Pcp Per  LOS - 0   Chief Complaint  Patient presents with  . Drug Overdose        Subjective:    Mindy Kelley today has no fevers, no emesis,  No chest pain, resting comfortably, no new concerns, eating and drinking well,  Assessment  & Plan :    Principal Problem:   Suicide attempt by drug ingestion [facility]) Active Problems:   Hematemesis   Opioid overdose ([place])   Leukocytosis   Hypocalcemia  Brief Summary:- 33 year old admitted on 07/02/19 with concerns about overdose and suicidal attempt--- behavioral health counselor recommends inpatient psychiatric treatment. ----Patient is currently medically cleared --- okay to transfer to inpatient psychiatric facility when bed is available --Apparently patient was not IVCD because she voluntarily agreed to go to inpatient psychiatric facility  A/p 1)Hematemesis--resolved, Gastroccult was positive-  -Hemoglobin s down to 9.6 after IV hydration ,  admission Hgb was 12.3, continue iv Protonix today, transitioned to p.o Protonix in the a.m.  Consider endoluminal evaluation if recurrent episodes of hematemesis or further significant drop in H&H  2)Leukocytosis--- was probably reactive, now resolved.  Patient is afebrile  3) suicidal attempt with ingestion of opiates----patient is medically cleared okay to transfer to a psychiatric facility when bed available. --UDS was positive for opiates and THC,  --Initial Tylenol level was 12 ,repeat Tylenol level less than 10 --Apparently patient was not IVCD because she voluntarily agreed to go to inpatient psychiatric facility   Disposition/Need for in-Hospital Stay- patient unable to be discharged  at this time --Patient is currently medically cleared --- okay to transfer to inpatient psychiatric facility when bed is available   Code Status : full  Family Communication:   NA (patient is alert, awake and coherent)   Disposition Plan  : [place]  Consults  :  psych  DVT Prophylaxis  : - SCDs   Lab Results  Component Value Date   PLT 206 07/03/2019    Inpatient Medications  Scheduled Meds: . [START ON 07/05/2019] pantoprazole  40 mg Intravenous Q12H   Continuous Infusions: . sodium chloride 100 mL/hr at 07/03/19 0547  . pantoprozole (PROTONIX) infusion 8 mg/hr (07/03/19 1422)   PRN Meds:.ondansetron **OR** ondansetron (ZOFRAN) IV, prochlorperazine    Anti-infectives (From admission, onward)   None        Objective:   Vitals:   07/03/19 1100 07/03/19 1200 07/03/19 1300 07/03/19 1408  BP: 123/74 123/87 113/82 (!) 149/87  Pulse: 72 (!) 56 (!) 51 (!) 52  Resp: 14 15 14 16   Temp:    98.6 F (37 C)  TempSrc:    Oral  SpO2: 100% 100% 98% 100%  Weight:    102.2 kg  Height:        Wt Readings from Last 3 Encounters:  07/03/19 102.2 kg     Intake/Output Summary (Last 24 hours) at 07/03/2019 1424 Last data filed at 07/03/2019 1300 Gross per 24 hour  Intake 4246.21 ml  Output -  Net 4246.21 ml   Physical  Exam  Gen:- Awake Alert,  In no apparent distress  HEENT:- [place].AT, No sclera icterus Lip-status post cleft lip/palate repair/scar Neck-Supple Neck,No JVD,.  Lungs-  CTAB , fair symmetrical air movement CV- S1, S2 normal, regular  Abd-  +ve B.Sounds, Abd Soft, No tenderness,    Extremity/Skin:- No  edema, pedal pulses present  Psych-affect is appropriate, oriented x3 Neuro-no new focal deficits, no tremors   Data Review:   Micro Results Recent Results (from the past 240 hour(s))  SARS Coronavirus 2 (CEPHEID - Performed in [facility] hospital lab), Hosp Order     Status: None   Collection Time: 07/02/19  2:56 AM   Specimen: Nasopharyngeal Swab  Result  Value Ref Range Status   SARS Coronavirus 2 NEGATIVE NEGATIVE Final    Comment: (NOTE) If result is NEGATIVE SARS-CoV-2 target nucleic acids are NOT DETECTED. The SARS-CoV-2 RNA is generally detectable in upper and lower  respiratory specimens during the acute phase of infection. The lowest  concentration of SARS-CoV-2 viral copies this assay can detect is 250  copies / mL. A negative result does not preclude SARS-CoV-2 infection  and should not be used as the sole basis for treatment or other  patient management decisions.  A negative result may occur with  improper specimen collection / handling, submission of specimen other  than nasopharyngeal swab, presence of viral mutation(s) within the  areas targeted by this assay, and inadequate number of viral copies  (<250 copies / mL). A negative result must be combined with clinical  observations, patient history, and epidemiological information. If result is POSITIVE SARS-CoV-2 target nucleic acids are DETECTED. The SARS-CoV-2 RNA is generally detectable in upper and lower  respiratory specimens dur ing the acute phase of infection.  Positive  results are indicative of active infection with SARS-CoV-2.  Clinical  correlation with patient history and other diagnostic information is  necessary to determine patient infection status.  Positive results do  not rule out bacterial infection or co-infection with other viruses. If result is PRESUMPTIVE POSTIVE SARS-CoV-2 nucleic acids MAY BE PRESENT.   A presumptive positive result was obtained on the submitted specimen  and confirmed on repeat testing.  While 2019 novel coronavirus  (SARS-CoV-2) nucleic acids may be present in the submitted sample  additional confirmatory testing may be necessary for epidemiological  and / or clinical management purposes  to differentiate between  SARS-CoV-2 and other Sarbecovirus currently known to infect humans.  If clinically indicated additional testing  with an alternate test  methodology [phone]([id number]) is advised. The SARS-CoV-2 RNA is generally  detectable in upper and lower respiratory sp ecimens during the acute  phase of infection. The expected result is Negative. Fact Sheet for Patients:  BoilerBrush.com.cyhttps://www.fda.gov/media/136312/download Fact Sheet for Healthcare Providers: https://pope.com/https://www.fda.gov/media/136313/download This test is not yet approved or cleared by the [place] FDA and has been authorized for detection and/or diagnosis of SARS-CoV-2 by FDA under an Emergency Use Authorization (EUA).  This EUA will remain in effect (meaning this test can be used) for the duration of the COVID-19 declaration under Section 564(b)(1) of the Act, 21 U.S.C. section 360bbb-3(b)(1), unless the authorization is terminated or revoked sooner. Performed at [facility], [address] [place], [place] [address]   MRSA PCR Screening     Status: None   Collection Time: 07/02/19 12:13 PM   Specimen: Nasal Mucosa; Nasopharyngeal  Result Value Ref Range Status   MRSA by PCR NEGATIVE NEGATIVE Final    Comment:  The GeneXpert MRSA Assay (FDA approved for NASAL specimens only), is one component of a comprehensive MRSA colonization surveillance program. It is not intended to diagnose MRSA infection nor to guide or monitor treatment for MRSA infections. Performed at [facility], [address] [place], [place] [address]     Radiology Reports No results found.   CBC Recent Labs  Lab 07/01/19 2345 07/02/19 0525 07/02/19 1220 07/02/19 1853 07/03/19 0351  WBC 23.0* 17.7*  --   --  9.5  HGB 12.3 12.2 11.5* 10.0* 9.6*  HCT 40.9 40.1  --   --  32.4*  PLT 297 280  --   --  206  MCV 84.9 85.1  --   --  86.2  MCH 25.5* 25.9*  --   --  25.5*  MCHC 30.1 30.4  --   --  29.6*  RDW 13.5 13.7  --   --  13.6  LYMPHSABS 0.8 0.7  --   --  3.8  MONOABS 0.9 0.6  --   --  0.4  EOSABS 0.1 0.0  --   --  0.0  BASOSABS 0.0 0.0  --   --  0.0     Chemistries  Recent Labs  Lab 07/01/19 2345 07/02/19 0525  NA 141 141  K 3.9 5.2*  CL 107 108  CO2 21* 24  GLUCOSE 106* 95  BUN 14 12  CREATININE 1.18* 0.84  CALCIUM 8.4* 8.3*  MG  --  2.0  AST 64* 62*  ALT 38 38  ALKPHOS 51 46  BILITOT 0.6 0.6   ------------------------------------------------------------------------------------------------------------------ No results for input(s): CHOL, HDL, LDLCALC, TRIG, CHOLHDL, LDLDIRECT in the last 72 hours.  No results found for: HGBA1C ------------------------------------------------------------------------------------------------------------------ No results for input(s): TSH, T4TOTAL, T3FREE, THYROIDAB in the last 72 hours.  Invalid input(s): FREET3 ------------------------------------------------------------------------------------------------------------------ No results for input(s): VITAMINB12, FOLATE, FERRITIN, TIBC, IRON, RETICCTPCT in the last 72 hours.  Coagulation profile No results for input(s): INR, PROTIME in the last 168 hours.  No results for input(s): DDIMER in the last 72 hours.  Cardiac Enzymes No results for input(s): CKMB, TROPONINI, MYOGLOBIN in the last 168 hours.  Invalid input(s): CK ------------------------------------------------------------------------------------------------------------------ No results found for: BNP   Shon Haleourage Mikeal Winstanley M.D on 07/03/2019 at 2:24 PM  Go to [email or web address] - for contact info  [facility] - Office  [phone]

## 2019-07-03 NOTE — Discharge Summary (Signed)
Mindy QuestShameka Kelley, is a 33 y.o. female  DOB 02/23/1986  MRN [id number].  Admission date:  07/01/2019  Admitting Physician  Bobette Moavid Manuel Ortiz, MD  Discharge Date:  07/03/2019   Primary MD  Patient, No Pcp Per  Recommendations for primary care physician for things to follow:     Admission Diagnosis  Suicide attempt [facility](HCC) [T14.91XA] UGI bleed [K92.2] Narcotic overdose, intentional self-harm, initial encounter (HCC) [T40.602A] Hypotension, unspecified hypotension type [I95.9] Depression, unspecified depression type [F32.9]   Discharge Diagnosis  Suicide attempt (HCC) [T14.91XA] UGI bleed [K92.2] Narcotic overdose, intentional self-harm, initial encounter (HCC) [T40.602A] Hypotension, unspecified hypotension type [I95.9] Depression, unspecified depression type [F32.9]    Principal Problem:   Suicide attempt by drug ingestion (HCC) Active Problems:   Hematemesis   Opioid overdose (HCC)   Leukocytosis   Hypocalcemia      Past Medical History:  Diagnosis Date   Cleft lip    GERD (gastroesophageal reflux disease)    Opioid overdose (HCC)    Suicide attempt (HCC)    Took multiple opioids.    Past Surgical History:  Procedure Laterality Date   ABDOMINAL HYSTERECTOMY     CLEFT LIP REPAIR     PILONIDAL CYST EXCISION         HPI  from the history and physical done on the day of admission:    Chief Complaint: Multiple meds OD.  HPI: Mindy AbuShaneka Kelley is a 33 y.o. female with medical history significant of GERD, opiate overdose, cleft lip, headache who was brought to the emergency department due to altered mental status and hypoxia in the 30s when EMS arrived on scene.  They gave her 6 to 8 mg of Narcan and brought her to the emergency department.  She received another milligram of naloxone in the emergency department.  She initially has said that she took hydromorphone, but also admits  taking several tablets of hydrocodone and several tablets of tramadol.  However, the controlled substance database does not show this being prescribed to her in the past 2 years.  She also used her Flexeril yesterday afternoon. She stated that she was intending to harm herself.  She had an episode of emesis in the emergency department which showed bloody streaks and was positive for Hemoccult. She otherwise denies any other symptoms like fever, chills, sore throat, rhinorrhea, dyspnea, chest pain, palpitations, dizziness, diaphoresis, PND or orthopnea, but states she occasionally gets lower extremity edema.  She denies dysuria, frequency or hematuria.  No polyuria, polydipsia, polyphagia or blurred vision.  ED Course: Initial temperature is 97.9 F, pulse 100, respiration 14, blood pressure 120/73 mmHg and O2 sat 95% on room air.  The patient subsequently became hypoxic and hypotensive, but this resolved with naloxone and IV fluid boluses.  Her UDS was positive for opiates and THC.  Urinalysis shows small hemoglobinuria, ketonuria 5 and proteinuria of 100 mg/dL.  Microscopic semination shows rare bacteria.  Her CBC showed a white count of 23.0 with 92% neutrophils, hemoglobin 12.3 g/dL and platelets [id number].  CMP  shows a CO2 of 21 mmol/L and a calcium of 8.4 mg/dL.  All other electrolytes are within normal limits.  Glucose 106, BUN 14, creatinine 9.50, salicylate less than 7 and alcohol less than 10 mg/dL.  Acetaminophen level was 12 mcg/mL.  LFTs were normal, except for an AST of 64.     Hospital Course:   Brief Summary:- 33 year old admitted on 07/02/19 with concerns about overdose and suicidal attempt---behavioral health counselor recommends inpatient psychiatric treatment. ----Patient is currently medically cleared---okay to transfer toinpatient psychiatric facilitywhenbed is available --Apparently patient was not IVCD because she voluntarily agreed to go to inpatient  psychiatricfacility  A/p 1)Hematemesis--resolved, Gastroccult was positive- -Hemoglobin is down to 9.6 after IV hydration ,  admissionHgbwas 12.3, treated with iv Protonix, transitioned to p.o Protonix for another 4 weeks.   Consider endoluminal evaluation if recurrent episodes of hematemesis or dark stools  2)Leukocytosis--- was probably reactive, now resolved.  Patient is afebrile  3) suicidal attempt with ingestion of opiates----patient is medically cleared okay to transfer to a psychiatric facility when bed available. --UDS was positive for opiates and THC,  --Initial Tylenol level was 12,repeat Tylenol level less than 10 --Apparently patient was not IVCD because she voluntarily agreed to go to inpatient psychiatricfacility  Disposition-Patient is currently medically cleared---okay to transfer toinpatient psychiatric facilitywhenbed is available   Code Status : full  Family Communication:   NA (patient is alert, awake and coherent)   Disposition Plan  : [facility]  Consults  :  psych   Discharge Condition: stable  Diet and Activity recommendation:  As advised  Discharge Instructions     Discharge Instructions    Diet - low sodium heart healthy   Complete by: As directed    Increase activity slowly   Complete by: As directed        Discharge Medications     Allergies as of 07/03/2019      Reactions   Mango Flavor Anaphylaxis   Hormogen [estradiol] Swelling   Latex Swelling   Metformin And Related Diarrhea      Medication List    TAKE these medications   pantoprazole 40 MG tablet Commonly known as: Protonix Take 1 tablet (40 mg total) by mouth 2 (two) times daily before a meal.       Major procedures and Radiology Reports - PLEASE review detailed and final reports for all details, in brief -     No results found.  Micro Results    Recent Results (from the past 240 hour(s))  SARS Coronavirus 2 (CEPHEID - Performed in [place]  hospital lab), Hosp Order     Status: None   Collection Time: 07/02/19  2:56 AM   Specimen: Nasopharyngeal Swab  Result Value Ref Range Status   SARS Coronavirus 2 NEGATIVE NEGATIVE Final    Comment: (NOTE) If result is NEGATIVE SARS-CoV-2 target nucleic acids are NOT DETECTED. The SARS-CoV-2 RNA is generally detectable in upper and lower  respiratory specimens during the acute phase of infection. The lowest  concentration of SARS-CoV-2 viral copies this assay can detect is 250  copies / mL. A negative result does not preclude SARS-CoV-2 infection  and should not be used as the sole basis for treatment or other  patient management decisions.  A negative result may occur with  improper specimen collection / handling, submission of specimen other  than nasopharyngeal swab, presence of viral mutation(s) within the  areas targeted by this assay, and inadequate number of viral copies  (<250  copies / mL). A negative result must be combined with clinical  observations, patient history, and epidemiological information. If result is POSITIVE SARS-CoV-2 target nucleic acids are DETECTED. The SARS-CoV-2 RNA is generally detectable in upper and lower  respiratory specimens dur ing the acute phase of infection.  Positive  results are indicative of active infection with SARS-CoV-2.  Clinical  correlation with patient history and other diagnostic information is  necessary to determine patient infection status.  Positive results do  not rule out bacterial infection or co-infection with other viruses. If result is PRESUMPTIVE POSTIVE SARS-CoV-2 nucleic acids MAY BE PRESENT.   A presumptive positive result was obtained on the submitted specimen  and confirmed on repeat testing.  While 2019 novel coronavirus  (SARS-CoV-2) nucleic acids may be present in the submitted sample  additional confirmatory testing may be necessary for epidemiological  and / or clinical management purposes  to differentiate  between  SARS-CoV-2 and other Sarbecovirus currently known to infect humans.  If clinically indicated additional testing with an alternate test  methodology [id number]) is advised. The SARS-CoV-2 RNA is generally  detectable in upper and lower respiratory sp ecimens during the acute  phase of infection. The expected result is Negative. Fact Sheet for Patients:  BoilerBrush.com.cyhttps://www.fda.gov/media/136312/download Fact Sheet for Healthcare Providers: https://pope.com/https://www.fda.gov/media/136313/download This test is not yet approved or cleared by the [place] FDA and has been authorized for detection and/or diagnosis of SARS-CoV-2 by FDA under an Emergency Use Authorization (EUA).  This EUA will remain in effect (meaning this test can be used) for the duration of the COVID-19 declaration under Section 564(b)(1) of the Act, 21 U.S.C. section 360bbb-3(b)(1), unless the authorization is terminated or revoked sooner. Performed at [facility], [address] [place], [place] [address]   MRSA PCR Screening     Status: None   Collection Time: 07/02/19 12:13 PM   Specimen: Nasal Mucosa; Nasopharyngeal  Result Value Ref Range Status   MRSA by PCR NEGATIVE NEGATIVE Final    Comment:        The GeneXpert MRSA Assay (FDA approved for NASAL specimens only), is one component of a comprehensive MRSA colonization surveillance program. It is not intended to diagnose MRSA infection nor to guide or monitor treatment for MRSA infections. Performed at [facility], [address] [place], [place] [address]        Today   Subjective    Mindy Kelley today has no new concerns.....  No dark stools No emesis No abd pain          Patient has been seen and examined prior to discharge   Objective   Blood pressure (!) 149/87, pulse (!) 52, temperature 98.6 F (37 C), temperature source Oral, resp. rate 16, height 5\' 5"  (1.651 m), weight 102.2 kg, SpO2 100 %.   Intake/Output Summary (Last 24 hours) at  07/03/2019 1829 Last data filed at 07/03/2019 1300 Gross per 24 hour  Intake 3158.32 ml  Output --  Net 3158.32 ml    Exam  Gen:- Awake Alert,  In no apparent distress  HEENT:- Mindy Kelley.AT, No sclera icterus Lip-status post cleft lip/palate repair/scar Neck-Supple Neck,No JVD,.  Lungs-  CTAB , fair symmetrical air movement CV- S1, S2 normal, regular  Abd-  +ve B.Sounds, Abd Soft, No tenderness,    Extremity/Skin:- No  edema, pedal pulses present  Psych-affect is appropriate, oriented x3 Neuro-no new focal deficits, no tremors   Data Review   CBC w Diff:  Lab Results  Component Value  Date   WBC 9.5 07/03/2019   HGB 9.6 (L) 07/03/2019   HCT 32.4 (L) 07/03/2019   PLT 206 07/03/2019   LYMPHOPCT 40 07/03/2019   MONOPCT 5 07/03/2019   EOSPCT 0 07/03/2019   BASOPCT 0 07/03/2019    CMP:  Lab Results  Component Value Date   NA 141 07/02/2019   K 5.2 (H) 07/02/2019   CL 108 07/02/2019   CO2 24 07/02/2019   BUN 12 07/02/2019   CREATININE 0.84 07/02/2019   PROT 7.5 07/02/2019   ALBUMIN 3.8 07/02/2019   BILITOT 0.6 07/02/2019   ALKPHOS 46 07/02/2019   AST 62 (H) 07/02/2019   ALT 38 07/02/2019  .   Total Discharge time is about 33 minutes  Mindy Kelley M.D on 07/03/2019 at 6:29 PM  Go to [email or web address] -  for contact info  [facility] - Office  [phone]

## 2019-07-03 NOTE — Progress Notes (Signed)
Admission note:  Pt is a 33 year old AAF admitted to the services of Dr. Parke Poisson for depression, suicidal ideation following overdose of Dilaudid.  Pt states on admission that overdose was accidental.  Pt states that she has good support at home and will be living with her mother upon discharge.  Pt says the pain and financial stressors are her biggest.  Pt is pleasant and denies current SI.  Pt denies medical issues other than fibromyalgia and lupus.  Pt is severely allergic to mangos, passion fruit, and does not eat pork.  Pt's stated goal is to become well enough to go home.

## 2019-07-03 NOTE — Progress Notes (Signed)
Called [place] [facility] for company to transport patient to [facility]. Company will arrive around 2000 at the ED entrance to transport patient. Attending RN notified.

## 2019-07-04 DIAGNOSIS — T50901A Poisoning by unspecified drugs, medicaments and biological substances, accidental (unintentional), initial encounter: Secondary | ICD-10-CM

## 2019-07-04 DIAGNOSIS — T50904A Poisoning by unspecified drugs, medicaments and biological substances, undetermined, initial encounter: Secondary | ICD-10-CM

## 2019-07-04 LAB — CBC
HCT: 37.3 % (ref 36.0–46.0)
Hemoglobin: 11.5 g/dL — ABNORMAL LOW (ref 12.0–15.0)
MCH: 26.1 pg (ref 26.0–34.0)
MCHC: 30.8 g/dL (ref 30.0–36.0)
MCV: 84.6 fL (ref 80.0–100.0)
Platelets: 236 10*3/uL (ref 150–400)
RBC: 4.41 MIL/uL (ref 3.87–5.11)
RDW: 13.5 % (ref 11.5–15.5)
WBC: 6.7 10*3/uL (ref 4.0–10.5)
nRBC: 0 % (ref 0.0–0.2)

## 2019-07-04 LAB — COMPREHENSIVE METABOLIC PANEL
ALT: 30 U/L (ref 0–44)
AST: 25 U/L (ref 15–41)
Albumin: 3.8 g/dL (ref 3.5–5.0)
Alkaline Phosphatase: 39 U/L (ref 38–126)
Anion gap: 8 (ref 5–15)
BUN: 7 mg/dL (ref 6–20)
CO2: 31 mmol/L (ref 22–32)
Calcium: 9.3 mg/dL (ref 8.9–10.3)
Chloride: 105 mmol/L (ref 98–111)
Creatinine, Ser: 0.66 mg/dL (ref 0.44–1.00)
GFR calc Af Amer: >60 mL/min (ref >60)
GFR calc non Af Amer: >60 mL/min (ref >60)
Glucose, Bld: 99 mg/dL (ref 70–99)
Potassium: 3.9 mmol/L (ref 3.5–5.1)
Sodium: 144 mmol/L (ref 135–145)
Total Bilirubin: 0.5 mg/dL (ref 0.3–1.2)
Total Protein: 7.1 g/dL (ref 6.5–8.1)

## 2019-07-04 LAB — TSH: TSH: 1.352 u[IU]/mL (ref 0.350–4.500)

## 2019-07-04 MED ORDER — LORAZEPAM 0.5 MG PO TABS: 0.5000 mg | ORAL_TABLET | Freq: Four times a day (QID) | ORAL | Status: DC | PRN

## 2019-07-04 NOTE — Plan of Care (Signed)
  Problem: Education: Goal: Knowledge of [facility] Education information/materials will improve Outcome: Progressing Goal: Emotional status will improve Outcome: Progressing Goal: Mental status will improve Outcome: Progressing   
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
## 2019-07-04 NOTE — [facility] Group Notes (Signed)
LCSW Group Therapy Note 07/04/2019 1:48 PM  Type of Therapy and Topic: Group Therapy: Overcoming Obstacles  Participation Level: Active  Description of Group:  In this group patients will be encouraged to explore what they see as obstacles to their own wellness and recovery. They will be guided to discuss their thoughts, feelings, and behaviors related to these obstacles. The group will process together ways to cope with barriers, with attention given to specific choices patients can make. Each patient will be challenged to identify changes they are motivated to make in order to overcome their obstacles. This group will be process-oriented, with patients participating in exploration of their own experiences as well as giving and receiving support and challenge from other group members.  Therapeutic Goals: 1. Patient will identify personal and current obstacles as they relate to admission. 2. Patient will identify barriers that currently interfere with their wellness or overcoming obstacles.  3. Patient will identify feelings, thought process and behaviors related to these barriers. 4. Patient will identify two changes they are willing to make to overcome these obstacles:   Summary of Patient Progress  Shalice was engaged and participated throughout the group session. Haliegh reports she does not have any current obstacles at this time.    Therapeutic Modalities:  Cognitive Behavioral Therapy Solution Focused Therapy Motivational Interviewing Relapse Prevention Therapy   Theresa Duty Clinical Social Worker

## 2019-07-04 NOTE — Progress Notes (Signed)
Recreation Therapy Notes  Date:  7.13.20 Time: 0930 Location: [address] Dayroom  Group Topic: Stress Management  Goal Area(s) Addresses:  Patient will identify positive stress management techniques. Patient will identify benefits of using stress management post d/c.  Intervention:  Stress Management  Activity :  Meditation.  LRT played a meditation that focused on making the most of your day.  Patients were to listen and follow along as the meditation played to engage in activity.  Education:  Stress Management, Discharge Planning.   Education Outcome: Acknowledges Education  Clinical Observations/Feedback:  Pt did not attend group.    Saesha Llerenas, LRT/CTRS         Arvella Massingale A 07/04/2019 11:24 AM 
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
## 2019-07-04 NOTE — Tx Team (Signed)
Interdisciplinary Treatment and Diagnostic Plan Update  07/04/2019 Time of Session:  Mindy Kelley MRN: [id number]  Principal Diagnosis: <principal problem not specified>  Secondary Diagnoses: Active Problems:   MDD (major depressive disorder), severe (HCC)   Current Medications:  Current Facility-Administered Medications  Medication Dose Route Frequency Provider Last Rate Last Dose  . acetaminophen (TYLENOL) tablet 650 mg  650 mg Oral Q6H PRN Money, Lowry Ram, FNP      . alum & mag hydroxide-simeth (MAALOX/MYLANTA) 200-200-20 MG/5ML suspension 30 mL  30 mL Oral Q4H PRN Money, Lowry Ram, FNP      . hydrOXYzine (ATARAX/VISTARIL) tablet 25 mg  25 mg Oral TID PRN Money, Lowry Ram, FNP      . magnesium hydroxide (MILK OF MAGNESIA) suspension 30 mL  30 mL Oral Daily PRN Money, Lowry Ram, FNP      . pantoprazole (PROTONIX) EC tablet 40 mg  40 mg Oral Daily Money, Travis B, FNP      . traZODone (DESYREL) tablet 50 mg  50 mg Oral QHS PRN Money, Lowry Ram, FNP       PTA Medications: Medications Prior to Admission  Medication Sig Dispense Refill Last Dose  . pantoprazole (PROTONIX) 40 MG tablet Take 1 tablet (40 mg total) by mouth 2 (two) times daily before a meal. 60 tablet 1     Patient Stressors: Financial difficulties Health problems  Patient Strengths: Active sense of humor Average or above average intelligence Supportive family/friends  Treatment Modalities: Medication Management, Group therapy, Case management,  1 to 1 session with clinician, Psychoeducation, Recreational therapy.   Physician Treatment Plan for Primary Diagnosis: <principal problem not specified> Long Term Goal(s):     Short Term Goals:    Medication Management: Evaluate patient's response, side effects, and tolerance of medication regimen.  Therapeutic Interventions: 1 to 1 sessions, Unit Group sessions and Medication administration.  Evaluation of Outcomes: Not Met  Physician Treatment Plan for Secondary  Diagnosis: Active Problems:   MDD (major depressive disorder), severe ([place])  Long Term Goal(s):     Short Term Goals:       Medication Management: Evaluate patient's response, side effects, and tolerance of medication regimen.  Therapeutic Interventions: 1 to 1 sessions, Unit Group sessions and Medication administration.  Evaluation of Outcomes: Not Met   RN Treatment Plan for Primary Diagnosis: <principal problem not specified> Long Term Goal(s): Knowledge of disease and therapeutic regimen to maintain health will improve  Short Term Goals: Ability to participate in decision making will improve, Ability to verbalize feelings will improve, Ability to disclose and discuss suicidal ideas, Ability to identify and develop effective coping behaviors will improve and Compliance with prescribed medications will improve  Medication Management: RN will administer medications as ordered by provider, will assess and evaluate patient's response and provide education to patient for prescribed medication. RN will report any adverse and/or side effects to prescribing provider.  Therapeutic Interventions: 1 on 1 counseling sessions, Psychoeducation, Medication administration, Evaluate responses to treatment, Monitor vital signs and CBGs as ordered, Perform/monitor CIWA, COWS, AIMS and Fall Risk screenings as ordered, Perform wound care treatments as ordered.  Evaluation of Outcomes: Not Met   LCSW Treatment Plan for Primary Diagnosis: <principal problem not specified> Long Term Goal(s): Safe transition to appropriate next level of care at discharge, Engage patient in therapeutic group addressing interpersonal concerns.  Short Term Goals: Engage patient in aftercare planning with referrals and resources  Therapeutic Interventions: Assess for all discharge needs, 1 to 1 time with  Social worker, Explore available resources and support systems, Assess for adequacy in community support network, Educate  family and significant other(s) on suicide prevention, Complete Psychosocial Assessment, Interpersonal group therapy.  Evaluation of Outcomes: Not Met   Progress in Treatment: Attending groups: No. New to unit.  Participating in groups: No. Taking medication as prescribed: Yes. Toleration medication: Yes. Family/Significant other contact made: No, will contact:  if patient consents to collateral contacts Patient understands diagnosis: Yes. Discussing patient identified problems/goals with staff: Yes. Medical problems stabilized or resolved: Yes. Denies suicidal/homicidal ideation: Yes. Issues/concerns per patient self-inventory: No. Other:   New problem(s) identified: None   New Short Term/Long Term Goal(s):Detox, medication stabilization, elimination of SI thoughts, development of comprehensive mental wellness plan.    Patient Goals:    Discharge Plan or Barriers: Patient recently admitted. CSW will continue to follow and assess for appropriate referrals and possible discharge planning.    Reason for Continuation of Hospitalization: Anxiety Depression Medication stabilization Suicidal ideation  Estimated Length of Stay: 3-5 days   Attendees: Patient: 07/04/2019 8:19 AM  Physician: Dr. Neita Garnet, MD 07/04/2019 8:19 AM  Nursing: Yetta Flock.Lenna Sciara RN  07/04/2019 8:19 AM  RN Care Manager: 07/04/2019 8:19 AM  Social Worker: Radonna Ricker, [place] 07/04/2019 8:19 AM  Recreational Therapist:  07/04/2019 8:19 AM  Other:  07/04/2019 8:19 AM  Other:  07/04/2019 8:19 AM  Other: 07/04/2019 8:19 AM    Scribe for Treatment Team: Marylee Floras, [place] 07/04/2019 8:19 AM

## 2019-07-05 NOTE — Progress Notes (Signed)

## 2019-07-05 NOTE — Progress Notes (Signed)
[facility] MD Progress Note  07/05/2019 11:02 AM Mindy Kelley  MRN:  [id number] Subjective:  "I'm good."  Mindy Kelley found sitting in the dayroom, talking on the phone. She presents with bright affect and reports good mood. She has been visible in the unit milieu and interacting appropriately with peers and staff. She continues to deny any recent depression or anxiety and states "I was only taking the medicine to control my pain." She states the Dilaudid was an old prescription that she still had on hand due to taking it rarely. She denies taking other opioids over the last six months, states that she typically takes Flexeril and Robaxin but saves the Dilaudid for severe pain. PDMP shows last controlled substance rx was tramadol in January 2019. She reports good sleep, energy and appetite. Denies depressed mood, anxiety, SI. Constipation from yesterday resolved. She has been speaking with fiance on the phone.  From admission H&P: Mindy Kelley is a 33 year old female with history of anxiety, SLE, and fibromyalgia, presenting for treatment after overdose on ~10-20 mg Dilaudid. She reports that she was having increased pain symptoms from fibromyalgia and kept taking more Dilaudid due to acute pain. She was found unresponsive by her fiance, who called EMS. Per EMS she had pulse ox 34% and heart rate 45.   Principal Problem: <principal problem not specified> Diagnosis: Active Problems:   MDD (major depressive disorder), severe ([place])   Drug overdose  Total Time spent with patient: 15 minutes  Past Psychiatric History: See admission H&P  Past Medical History:  Past Medical History:  Diagnosis Date  . Cleft lip   . GERD (gastroesophageal reflux disease)   . Opioid overdose ([place])   . Suicide attempt ([place])    Took multiple opioids.    Past Surgical History:  Procedure Laterality Date  . ABDOMINAL HYSTERECTOMY    . CLEFT LIP REPAIR    . PILONIDAL CYST EXCISION     Family History:  Family History   Problem Relation Age of Onset  . Fibromyalgia Mother   . Seizures Brother   . Diabetes Mellitus I Brother    Family Psychiatric  History: See admission H&P Social History:  Social History   Substance and Sexual Activity  Alcohol Use Not Currently     Social History   Substance and Sexual Activity  Drug Use Not Currently    Social History   Socioeconomic History  . Marital status: Divorced    Spouse name: Not on file  . Number of children: Not on file  . Years of education: Not on file  . Highest education level: Not on file  Occupational History  . Not on file  Social Needs  . Financial resource strain: Hard  . Food insecurity    Worry: Patient refused    Inability: Patient refused  . Transportation needs    Medical: Patient refused    Non-medical: Patient refused  Tobacco Use  . Smoking status: Never Smoker  . Smokeless tobacco: Never Used  Substance and Sexual Activity  . Alcohol use: Not Currently  . Drug use: Not Currently  . Sexual activity: Yes    Birth control/protection: None  Lifestyle  . Physical activity    Days per week: Patient refused    Minutes per session: Patient refused  . Stress: Rather much  Relationships  . Social connections    Talks on phone: Patient refused    Gets together: Patient refused    Attends religious service: Patient refused  Active member of club or organization: Patient refused    Attends meetings of clubs or organizations: Patient refused    Relationship status: Patient refused  Other Topics Concern  . Not on file  Social History Narrative  . Not on file   Additional Social History:                         Sleep: Good  Appetite:  Good  Current Medications: Current Facility-Administered Medications  Medication Dose Route Frequency Provider Last Rate Last Dose  . acetaminophen (TYLENOL) tablet 650 mg  650 mg Oral Q6H PRN Money, Gerlene Burdockravis B, FNP      . alum & mag hydroxide-simeth (MAALOX/MYLANTA)  200-200-20 MG/5ML suspension 30 mL  30 mL Oral Q4H PRN Money, Gerlene Burdockravis B, FNP      . LORazepam (ATIVAN) tablet 0.5 mg  0.5 mg Oral Q6H PRN Cobos, Fernando A, MD      . magnesium hydroxide (MILK OF MAGNESIA) suspension 30 mL  30 mL Oral Daily PRN Money, Gerlene Burdockravis B, FNP      . pantoprazole (PROTONIX) EC tablet 40 mg  40 mg Oral Daily Money, Gerlene Burdockravis B, [place]        Lab Results:  Results for orders placed or performed during the hospital encounter of 07/03/19 (from the past 48 hour(s))  CBC     Status: Abnormal   Collection Time: 07/04/19  6:39 AM  Result Value Ref Range   WBC 6.7 4.0 - 10.5 K/uL   RBC 4.41 3.87 - 5.11 MIL/uL   Hemoglobin 11.5 (L) 12.0 - 15.0 g/dL   HCT 40.937.3 81.136.0 - 91.446.0 %   MCV 84.6 80.0 - 100.0 fL   MCH 26.1 26.0 - 34.0 pg   MCHC 30.8 30.0 - 36.0 g/dL   RDW 78.213.5 95.611.5 - 21.315.5 %   Platelets 236 150 - 400 K/uL   nRBC 0.0 0.0 - 0.2 %    Comment: Performed at [facility], [address] [place], [place] [address]  Comprehensive metabolic panel     Status: None   Collection Time: 07/04/19  6:39 AM  Result Value Ref Range   Sodium 144 135 - 145 mmol/L   Potassium 3.9 3.5 - 5.1 mmol/L   Chloride 105 98 - 111 mmol/L   CO2 31 22 - 32 mmol/L   Glucose, Bld 99 70 - 99 mg/dL   BUN 7 6 - 20 mg/dL   Creatinine, Ser 8.460.66 0.44 - 1.00 mg/dL   Calcium 9.3 8.9 - 96.210.3 mg/dL   Total Protein 7.1 6.5 - 8.1 g/dL   Albumin 3.8 3.5 - 5.0 g/dL   AST 25 15 - 41 U/L   ALT 30 0 - 44 U/L   Alkaline Phosphatase 39 38 - 126 U/L   Total Bilirubin 0.5 0.3 - 1.2 mg/dL   GFR calc non Af Amer >60 >60 mL/min   GFR calc Af Amer >60 >60 mL/min   Anion gap 8 5 - 15    Comment: Performed at [facility], [address] [place], [place] [address]  TSH     Status: None   Collection Time: 07/04/19  6:11 PM  Result Value Ref Range   TSH 1.352 0.350 - 4.500 uIU/mL    Comment: Performed by a 3rd Generation assay with a functional sensitivity of <=0.01 uIU/mL. Performed at  [facility], [address] [place], [place] [address]     Blood  Alcohol level:  Lab Results  Component Value Date   ETH <10 07/01/2019    Metabolic Disorder Labs: No results found for: HGBA1C, MPG No results found for: PROLACTIN No results found for: CHOL, TRIG, HDL, CHOLHDL, VLDL, LDLCALC  Physical Findings: AIMS: Facial and Oral Movements Muscles of Facial Expression: None, normal Lips and Perioral Area: None, normal Jaw: None, normal Tongue: None, normal,Extremity Movements Upper (arms, wrists, hands, fingers): None, normal Lower (legs, knees, ankles, toes): None, normal, Trunk Movements Neck, shoulders, hips: None, normal, Overall Severity Severity of abnormal movements (highest score from questions above): None, normal Incapacitation due to abnormal movements: None, normal Patient's awareness of abnormal movements (rate only patient's report): No Awareness, Dental Status Current problems with teeth and/or dentures?: No Does patient usually wear dentures?: No  CIWA:    COWS:     Musculoskeletal: Strength & Muscle Tone: within normal limits Gait & Station: normal Patient leans: N/A  Psychiatric Specialty Exam: Physical Exam  Nursing note and vitals reviewed. Constitutional: She is oriented to person, place, and time. She appears well-developed and well-nourished.  Cardiovascular: Normal rate.  Respiratory: Effort normal.  Neurological: She is alert and oriented to person, place, and time.    Review of Systems  Constitutional: Negative.   Respiratory: Negative for cough and shortness of breath.   Cardiovascular: Negative for chest pain.  Gastrointestinal: Negative for abdominal pain, constipation, diarrhea, nausea and vomiting.  Neurological: Negative for headaches.  Psychiatric/Behavioral: Negative for depression, hallucinations and suicidal ideas. The patient is not nervous/anxious and does not have insomnia.     Blood pressure 127/84,  pulse 66, temperature 98.2 F (36.8 C), resp. rate 16, height 5\' 5"  (1.651 m), weight 99.1 kg, SpO2 100 %.Body mass index is 36.36 kg/m.  General Appearance: Casual  Eye Contact:  Good  Speech:  Clear and Coherent and Normal Rate  Volume:  Normal  Mood:  Euthymic  Affect:  Appropriate and Congruent  Thought Process:  Coherent and Goal Directed  Orientation:  Full (Time, Place, and Person)  Thought Content:  Logical  Suicidal Thoughts:  No  Homicidal Thoughts:  No  Memory:  Immediate;   Good Recent;   Good  Judgement:  Fair  Insight:  Fair  Psychomotor Activity:  Normal  Concentration:  Concentration: Good  Recall:  Good  Fund of Knowledge:  Fair  Language:  Good  Akathisia:  No  Handed:  Right  AIMS (if indicated):     Assets:  Communication Skills Desire for Improvement Housing Leisure Time Resilience Social Support  ADL's:  Intact  Cognition:  WNL  Sleep:  Number of Hours: 6     Treatment Plan Summary: Daily contact with patient to assess and evaluate symptoms and progress in treatment and Medication management   Continue inpatient hospitalization. Obtain collateral information from fiance.  Continue Protonix 40 mg PO daily for GERD Continue Ativan 0.5 mg PO Q6HR PRN anxiety  Patient will participate in the therapeutic group milieu.  Discharge disposition in progress.   Aldean BakerJanet E Nile Prisk, NP 07/05/2019, 11:02 AM

## 2019-07-05 NOTE — Progress Notes (Signed)
The patient's event for the day is that she worked on "openness". She went on to explain that she offered advice to her peers and helped some people as well. Her goal for tomorrow is to work on increasing her energy level.

## 2019-07-05 NOTE — Progress Notes (Signed)
Spiritual care group on grief and loss facilitated by chaplain Jerene Pitch  Group Goal:  Support / Education around grief and loss Members engage in facilitated group support and psycho-social education.  Group Description:  Following introductions and group rules, group members engaged in facilitated group dialog and support around topic of loss, with particular support around experiences of loss in their lives. Group Identified types of loss (relationships / self / things) and identified patterns, circumstances, and changes that precipitate losses. Reflected on thoughts / feelings around loss, normalized grief responses, and recognized variety in grief experience. Patient Progress: Present throughout group.  Engaged actively in group discussion.  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  Provided support and normalization for others, reflected on ways she is reorienting and reclaiming the values of being a mother.  Described her most recent mothers day as the best yet due to being "seen" as a mother.

## 2019-07-05 NOTE — Plan of Care (Signed)
Progress note  D: pt found in her room. Pt states she didn't want her Protonix this morning and has been taking this PRN. Pt denies any physical complaints or pain. Pt denies si/hi/ah/vh and verbally agrees to approach staff if these become apparent or before harming himself/others while at [facility]: Pt provided support and encouragement. Pt given medication per protocol and standing orders. Q1m safety checks implemented and continued.  R: Pt safe on the unit. Will continue to monitor.  Pt progressing in the following metrics   Problem: Education: Goal: Verbalization of understanding the information provided will improve Outcome: Progressing   Problem: Activity: Goal: Interest or engagement in activities will improve Outcome: Progressing Goal: Sleeping patterns will improve Outcome: Progressing

## 2019-07-05 NOTE — Progress Notes (Signed)
[place] NOVEL CORONAVIRUS (COVID-19) DAILY CHECK-OFF SYMPTOMS - answer yes or no to each - every day NO YES  Have you had a fever in the past 24 hours?  . Fever (Temp > 37.80C / 100F) X   Have you had any of these symptoms in the past 24 hours? . New Cough .  Sore Throat  .  Shortness of Breath .  Difficulty Breathing .  Unexplained Body Aches   X   Have you had any one of these symptoms in the past 24 hours not related to allergies?   . Runny Nose .  Nasal Congestion .  Sneezing   X   If you have had runny nose, nasal congestion, sneezing in the past 24 hours, has it worsened?  X   EXPOSURES - check yes or no X   Have you traveled outside the state in the past 14 days?  X   Have you been in contact with someone with a confirmed diagnosis of COVID-19 or PUI in the past 14 days without wearing appropriate PPE?  X   Have you been living in the same home as a person with confirmed diagnosis of COVID-19 or a PUI (household contact)?    X   Have you been diagnosed with COVID-19?    X              What to do next: Answered NO to all: Answered YES to anything:   Proceed with unit schedule Follow the [facility] Inpatient Flowsheet.   

## 2019-07-06 NOTE — [facility] Suicide Risk Assessment (Signed)
[facility] Discharge Suicide Risk Assessment   Principal Problem: <principal problem not specified> Discharge Diagnoses: Active Problems:   MDD (major depressive disorder), severe ([place])   Drug overdose   Total Time spent with patient: 15 minutes  Musculoskeletal: Strength & Muscle Tone: within normal limits Gait & Station: normal Patient leans: N/A  Psychiatric Specialty Exam: Review of Systems  Eyes: Positive for redness.  Musculoskeletal: Positive for myalgias.  All other systems reviewed and are negative.   Blood pressure 124/77, pulse 72, temperature 98.2 F (36.8 C), temperature source Oral, resp. rate 16, height 5\' 5"  (1.651 m), weight 99.1 kg, SpO2 100 %.Body mass index is 36.36 kg/m.  General Appearance: Casual  Eye Contact::  Good  Speech:  Clear and Coherent409  Volume:  Normal  Mood:  Euthymic  Affect:  Congruent  Thought Process:  Coherent and Descriptions of Associations: Intact  Orientation:  Full (Time, Place, and Person)  Thought Content:  Logical  Suicidal Thoughts:  No  Homicidal Thoughts:  No  Memory:  Immediate;   Good Recent;   Good Remote;   Good  Judgement:  Intact  Insight:  Fair  Psychomotor Activity:  Normal  Concentration:  Good  Recall:  Good  Fund of Knowledge:Good  Language: Good  Akathisia:  Negative  Handed:  Right  AIMS (if indicated):     Assets:  Desire for Improvement Resilience  Sleep:  Number of Hours: 5.25  Cognition: WNL  ADL's:  Intact   Mental Status Per Nursing Assessment::   On Admission:  Suicidal ideation indicated by others, Self-harm behaviors  Demographic Factors:  NA  Loss Factors: Decline in physical health  Historical Factors: Impulsivity  Risk Reduction Factors:   Sense of responsibility to family, Positive social support and Positive coping skills or problem solving skills  Continued Clinical Symptoms:  Depression:   Impulsivity  Cognitive Features That Contribute To Risk:  None    Suicide Risk:   Minimal: No identifiable suicidal ideation.  Patients presenting with no risk factors but with morbid ruminations; may be classified as minimal risk based on the severity of the depressive symptoms  Follow-up Information    [facility] Follow up.   Why: Appointment for therapy services is..... Contact information: [address], [place], [place] [address]   Phone: [phone] Fax: ???           Plan Of Care/Follow-up recommendations:  Activity:  ad lib  Sharma Covert, MD 07/06/2019, 9:13 AM
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
## 2019-07-06 NOTE — [facility] Group Notes (Signed)
Occupational Therapy Group Note  Date:  07/06/2019 Time:  11:21 AM  Group Topic/Focus:  Self Esteem Action Plan:   The focus of this group is to help patients create a plan to continue to build self-esteem after discharge.  Participation Level:  Active  Participation Quality:  Appropriate  Affect:  Depressed and Flat  Cognitive:  Appropriate  Insight: Improving  Engagement in Group:  Engaged  Modes of Intervention:  Activity, Discussion, Education and Socialization  Additional Comments:    S: "Judgements and financial status decrease my self esteem"  O: OT tx with focus on self esteem building this date. Education given on definition of self esteem, with both causes of low and high self esteem identified. Activity given for pt to identify a positive/aspiring trait for each letter of the alphabet. Pt to work with peers to help complete activity and build positive thinking.   A: Pt presents with flat/depressed affect, engaged and participatory throughout session. Pt shares how judgement and financial status have decreased her self esteem. She shares a small story about how she has had difficulty finding a job given the current state of the economy and impacts from COVID-19. She shares that accomplishment and music help to increase her self esteem.  P: OT group will be x1 per week while pt inpatient.  Zenovia Jarred, MSOT, OTR/L Behavioral Health OT/ Acute Relief OT PHP Office: [place] 07/06/2019, 11:21 AM

## 2019-07-06 NOTE — Progress Notes (Signed)
Patient ID: Mindy Kelley, female   DOB: [birth year], 33 y.o.   MRN: [id number] Pt d/c to home with "fiance". D/c instructions given and reviewed. Belongings returned. Pt verbalizes understanding. Denies s.i.

## 2019-07-07 ENCOUNTER — Encounter (HOSPITAL_COMMUNITY): Payer: Self-pay
